# Patient Record
Sex: Female | Born: 1975 | Race: Black or African American | Hispanic: No | Marital: Single | Smoking: Never smoker
Health system: Southern US, Community
[De-identification: ages and names within clinical notes are randomized; demographics above are authoritative.]

## PROBLEM LIST (undated history)

## (undated) DIAGNOSIS — N926 Irregular menstruation, unspecified: Secondary | ICD-10-CM

## (undated) DIAGNOSIS — F419 Anxiety disorder, unspecified: Secondary | ICD-10-CM

## (undated) DIAGNOSIS — Z8742 Personal history of other diseases of the female genital tract: Secondary | ICD-10-CM

## (undated) DIAGNOSIS — B379 Candidiasis, unspecified: Secondary | ICD-10-CM

## (undated) DIAGNOSIS — IMO0002 Reserved for concepts with insufficient information to code with codable children: Secondary | ICD-10-CM

## (undated) DIAGNOSIS — R519 Headache, unspecified: Secondary | ICD-10-CM

## (undated) DIAGNOSIS — D649 Anemia, unspecified: Secondary | ICD-10-CM

## (undated) DIAGNOSIS — M549 Dorsalgia, unspecified: Secondary | ICD-10-CM

## (undated) DIAGNOSIS — E559 Vitamin D deficiency, unspecified: Secondary | ICD-10-CM

## (undated) DIAGNOSIS — R7303 Prediabetes: Secondary | ICD-10-CM

## (undated) DIAGNOSIS — Z8619 Personal history of other infectious and parasitic diseases: Secondary | ICD-10-CM

## (undated) DIAGNOSIS — E669 Obesity, unspecified: Secondary | ICD-10-CM

## (undated) DIAGNOSIS — Z973 Presence of spectacles and contact lenses: Secondary | ICD-10-CM

## (undated) DIAGNOSIS — N870 Mild cervical dysplasia: Secondary | ICD-10-CM

## (undated) DIAGNOSIS — R51 Headache: Secondary | ICD-10-CM

## (undated) HISTORY — DX: Irregular menstruation, unspecified: N92.6

## (undated) HISTORY — DX: Anxiety disorder, unspecified: F41.9

## (undated) HISTORY — DX: Anemia, unspecified: D64.9

## (undated) HISTORY — DX: Personal history of other infectious and parasitic diseases: Z86.19

## (undated) HISTORY — DX: Reserved for concepts with insufficient information to code with codable children: IMO0002

## (undated) HISTORY — DX: Dorsalgia, unspecified: M54.9

## (undated) HISTORY — DX: Mild cervical dysplasia: N87.0

## (undated) HISTORY — DX: Obesity, unspecified: E66.9

## (undated) HISTORY — DX: Presence of spectacles and contact lenses: Z97.3

## (undated) HISTORY — DX: Vitamin D deficiency, unspecified: E55.9

## (undated) HISTORY — DX: Candidiasis, unspecified: B37.9

## (undated) HISTORY — DX: Personal history of other diseases of the female genital tract: Z87.42

---

## 1997-11-20 ENCOUNTER — Encounter: Admission: RE | Admit: 1997-11-20 | Discharge: 1997-11-20 | Payer: Self-pay | Admitting: Sports Medicine

## 1998-01-31 ENCOUNTER — Encounter: Admission: RE | Admit: 1998-01-31 | Discharge: 1998-01-31 | Payer: Self-pay | Admitting: Family Medicine

## 1998-03-15 ENCOUNTER — Encounter: Admission: RE | Admit: 1998-03-15 | Discharge: 1998-03-15 | Payer: Self-pay | Admitting: Family Medicine

## 1998-07-08 ENCOUNTER — Encounter: Admission: RE | Admit: 1998-07-08 | Discharge: 1998-07-08 | Payer: Self-pay | Admitting: Family Medicine

## 1998-09-09 ENCOUNTER — Encounter: Admission: RE | Admit: 1998-09-09 | Discharge: 1998-09-09 | Payer: Self-pay | Admitting: Sports Medicine

## 1998-09-19 ENCOUNTER — Encounter: Admission: RE | Admit: 1998-09-19 | Discharge: 1998-09-19 | Payer: Self-pay | Admitting: Family Medicine

## 1998-10-09 ENCOUNTER — Encounter: Admission: RE | Admit: 1998-10-09 | Discharge: 1998-10-09 | Payer: Self-pay | Admitting: Family Medicine

## 1998-10-22 ENCOUNTER — Encounter: Admission: RE | Admit: 1998-10-22 | Discharge: 1998-10-22 | Payer: Self-pay | Admitting: Sports Medicine

## 1999-01-23 ENCOUNTER — Emergency Department (HOSPITAL_COMMUNITY): Admission: EM | Admit: 1999-01-23 | Discharge: 1999-01-23 | Payer: Self-pay | Admitting: Emergency Medicine

## 1999-01-24 ENCOUNTER — Emergency Department (HOSPITAL_COMMUNITY): Admission: EM | Admit: 1999-01-24 | Discharge: 1999-01-24 | Payer: Self-pay | Admitting: Emergency Medicine

## 1999-04-17 ENCOUNTER — Encounter: Admission: RE | Admit: 1999-04-17 | Discharge: 1999-04-17 | Payer: Self-pay | Admitting: Family Medicine

## 1999-06-10 ENCOUNTER — Encounter: Admission: RE | Admit: 1999-06-10 | Discharge: 1999-06-10 | Payer: Self-pay | Admitting: Sports Medicine

## 2000-02-11 ENCOUNTER — Encounter: Admission: RE | Admit: 2000-02-11 | Discharge: 2000-02-11 | Payer: Self-pay | Admitting: Family Medicine

## 2001-01-04 ENCOUNTER — Encounter: Admission: RE | Admit: 2001-01-04 | Discharge: 2001-01-04 | Payer: Self-pay | Admitting: Sports Medicine

## 2001-01-27 ENCOUNTER — Encounter (INDEPENDENT_AMBULATORY_CARE_PROVIDER_SITE_OTHER): Payer: Self-pay | Admitting: *Deleted

## 2001-01-27 LAB — CONVERTED CEMR LAB

## 2001-02-02 ENCOUNTER — Encounter: Admission: RE | Admit: 2001-02-02 | Discharge: 2001-02-02 | Payer: Self-pay | Admitting: Family Medicine

## 2001-03-04 ENCOUNTER — Encounter: Admission: RE | Admit: 2001-03-04 | Discharge: 2001-03-04 | Payer: Self-pay | Admitting: Family Medicine

## 2001-05-30 ENCOUNTER — Encounter: Admission: RE | Admit: 2001-05-30 | Discharge: 2001-05-30 | Payer: Self-pay | Admitting: Family Medicine

## 2002-05-04 ENCOUNTER — Encounter: Admission: RE | Admit: 2002-05-04 | Discharge: 2002-05-04 | Payer: Self-pay | Admitting: Family Medicine

## 2004-07-17 ENCOUNTER — Other Ambulatory Visit: Admission: RE | Admit: 2004-07-17 | Discharge: 2004-07-17 | Payer: Self-pay | Admitting: Family Medicine

## 2005-07-06 ENCOUNTER — Other Ambulatory Visit: Admission: RE | Admit: 2005-07-06 | Discharge: 2005-07-06 | Payer: Self-pay | Admitting: Obstetrics and Gynecology

## 2005-09-20 ENCOUNTER — Emergency Department (HOSPITAL_COMMUNITY): Admission: EM | Admit: 2005-09-20 | Discharge: 2005-09-21 | Payer: Self-pay | Admitting: Emergency Medicine

## 2006-06-29 DIAGNOSIS — Z8742 Personal history of other diseases of the female genital tract: Secondary | ICD-10-CM

## 2006-06-29 HISTORY — DX: Personal history of other diseases of the female genital tract: Z87.42

## 2006-08-27 ENCOUNTER — Encounter (INDEPENDENT_AMBULATORY_CARE_PROVIDER_SITE_OTHER): Payer: Self-pay | Admitting: *Deleted

## 2006-12-14 DIAGNOSIS — N926 Irregular menstruation, unspecified: Secondary | ICD-10-CM

## 2006-12-14 HISTORY — DX: Irregular menstruation, unspecified: N92.6

## 2008-06-29 HISTORY — PX: COLPOSCOPY: SHX161

## 2008-12-02 ENCOUNTER — Emergency Department (HOSPITAL_COMMUNITY): Admission: EM | Admit: 2008-12-02 | Discharge: 2008-12-03 | Payer: Self-pay | Admitting: Emergency Medicine

## 2009-08-13 DIAGNOSIS — IMO0002 Reserved for concepts with insufficient information to code with codable children: Secondary | ICD-10-CM

## 2009-08-13 DIAGNOSIS — R87612 Low grade squamous intraepithelial lesion on cytologic smear of cervix (LGSIL): Secondary | ICD-10-CM

## 2009-08-13 HISTORY — DX: Reserved for concepts with insufficient information to code with codable children: IMO0002

## 2009-08-13 HISTORY — DX: Low grade squamous intraepithelial lesion on cytologic smear of cervix (LGSIL): R87.612

## 2010-07-30 DIAGNOSIS — N870 Mild cervical dysplasia: Secondary | ICD-10-CM

## 2010-07-30 HISTORY — DX: Mild cervical dysplasia: N87.0

## 2012-01-12 ENCOUNTER — Ambulatory Visit: Payer: Self-pay | Admitting: Obstetrics and Gynecology

## 2012-03-15 ENCOUNTER — Ambulatory Visit: Payer: Self-pay | Admitting: Family Medicine

## 2012-03-16 ENCOUNTER — Ambulatory Visit (INDEPENDENT_AMBULATORY_CARE_PROVIDER_SITE_OTHER): Payer: BC Managed Care – PPO | Admitting: Family Medicine

## 2012-03-16 ENCOUNTER — Encounter: Payer: Self-pay | Admitting: Family Medicine

## 2012-03-16 ENCOUNTER — Other Ambulatory Visit (HOSPITAL_COMMUNITY)
Admission: RE | Admit: 2012-03-16 | Discharge: 2012-03-16 | Disposition: A | Discharge status: Home / Self Care | Payer: BC Managed Care – PPO | Source: Ambulatory Visit | Attending: Family Medicine | Admitting: Family Medicine

## 2012-03-16 VITALS — BP 138/76 | HR 89 | Temp 99.1°F | Ht 63.0 in | Wt 233.3 lb

## 2012-03-16 DIAGNOSIS — Z124 Encounter for screening for malignant neoplasm of cervix: Secondary | ICD-10-CM

## 2012-03-16 DIAGNOSIS — Z309 Encounter for contraceptive management, unspecified: Secondary | ICD-10-CM

## 2012-03-16 DIAGNOSIS — Z01419 Encounter for gynecological examination (general) (routine) without abnormal findings: Secondary | ICD-10-CM | POA: Insufficient documentation

## 2012-03-16 DIAGNOSIS — N870 Mild cervical dysplasia: Secondary | ICD-10-CM | POA: Insufficient documentation

## 2012-03-16 NOTE — Patient Instructions (Addendum)
It was nice to meet you today, Abigail Duran. Continue birth control pills this year.  If you are interested in changing to Nexplanon, please call us. For headaches, try using heating pads and taking Aleve to see if this helps. You may also try monthly massages. We will call or send a letter with pap results. Schedule follow up appointment with me in 3 months.

## 2012-03-16 NOTE — Assessment & Plan Note (Signed)
Repeat pap today

## 2012-03-16 NOTE — Assessment & Plan Note (Addendum)
Gave patient handout on Nexplanon.  Continue OCP - may need to stop this after > 34 years old.

## 2012-03-16 NOTE — Progress Notes (Signed)
  Subjective:     Abigail Duran is a 36 y.o. female and is here for a comprehensive physical exam. The patient reports intermittent headaches.  She has been taking OCP for about one year and is thinking about stopping them due to HA.  She is sexually active and almost 36 yo, but does not smoke.  HA are relieved with NSAIDS and are not associated with nausea/vomiting or aura.   Otherwise, she has no acute complaints.  Patient has been trying to lose weight - she walks about 4 times per week and has been on a diet.  She works at [employer].  Of note, patient is here for a pap.  She has been diagnosed with CIN 1 in 2012.  History   Social History  . Marital Status: Married    Spouse Name: N/A    Number of Children: N/A  . Years of Education: N/A   Occupational History  . Not on file.   Social History Main Topics  . Smoking status: Never Smoker   . Smokeless tobacco: Never Used  . Alcohol Use: No  . Drug Use: No  . Sexually Active: Yes   Other Topics Concern  . Not on file   Social History Narrative  . No narrative on file   No health maintenance topics applied.  The following portions of the patient's history were reviewed and updated as appropriate: allergies, current medications, past family history, past medical history, past social history, past surgical history and problem list.  Review of Systems Pertinent items are noted in HPI.   Objective:    General appearance: alert, cooperative and no distress Head: Normocephalic, without obvious abnormality, atraumatic Neck: no adenopathy, no carotid bruit and no JVD Lungs: clear to auscultation bilaterally Heart: regular rate and rhythm, S1, S2 normal, no murmur, click, rub or gallop Abdomen: soft, non-tender; bowel sounds normal; no masses,  no organomegaly and obese Pelvic: cervix normal in appearance, external genitalia normal, no adnexal masses or tenderness, no cervical motion tenderness and uterus normal size, shape, and  consistency Skin: Skin color, texture, turgor normal. No rashes or lesions Neurologic: Grossly normal    Assessment:    Healthy female exam.   Hx of LGSIL  --> CIN 1.     Plan:   See Problem List.   See After Visit Summary for Counseling Recommendations

## 2012-03-16 NOTE — Addendum Note (Signed)
Addended by: Jimmy Footman K on: 03/16/2012 05:07 PM   Modules accepted: Orders

## 2012-03-21 ENCOUNTER — Encounter: Payer: Self-pay | Admitting: Family Medicine

## 2013-07-04 ENCOUNTER — Encounter: Payer: Self-pay | Admitting: Medical

## 2013-07-19 ENCOUNTER — Encounter: Payer: Self-pay | Admitting: Medical

## 2013-07-19 ENCOUNTER — Other Ambulatory Visit (HOSPITAL_COMMUNITY)
Admission: RE | Admit: 2013-07-19 | Discharge: 2013-07-19 | Disposition: A | Discharge status: Home / Self Care | Payer: Commercial Indemnity | Source: Ambulatory Visit | Attending: Medical | Admitting: Medical

## 2013-07-19 ENCOUNTER — Ambulatory Visit (INDEPENDENT_AMBULATORY_CARE_PROVIDER_SITE_OTHER): Payer: Managed Care, Other (non HMO) | Admitting: Medical

## 2013-07-19 VITALS — BP 124/82 | HR 78 | Temp 98.1°F | Resp 16 | Ht 64.0 in | Wt 238.0 lb

## 2013-07-19 DIAGNOSIS — Z124 Encounter for screening for malignant neoplasm of cervix: Secondary | ICD-10-CM

## 2013-07-19 DIAGNOSIS — Z113 Encounter for screening for infections with a predominantly sexual mode of transmission: Secondary | ICD-10-CM

## 2013-07-19 DIAGNOSIS — Z0000 Encounter for general adult medical examination without abnormal findings: Secondary | ICD-10-CM

## 2013-07-19 DIAGNOSIS — E669 Obesity, unspecified: Secondary | ICD-10-CM

## 2013-07-19 DIAGNOSIS — R87810 Cervical high risk human papillomavirus (HPV) DNA test positive: Secondary | ICD-10-CM | POA: Insufficient documentation

## 2013-07-19 DIAGNOSIS — Z23 Encounter for immunization: Secondary | ICD-10-CM

## 2013-07-19 DIAGNOSIS — L709 Acne, unspecified: Secondary | ICD-10-CM

## 2013-07-19 DIAGNOSIS — R51 Headache: Secondary | ICD-10-CM

## 2013-07-19 DIAGNOSIS — R519 Headache, unspecified: Secondary | ICD-10-CM

## 2013-07-19 DIAGNOSIS — Z1151 Encounter for screening for human papillomavirus (HPV): Secondary | ICD-10-CM | POA: Insufficient documentation

## 2013-07-19 DIAGNOSIS — L708 Other acne: Secondary | ICD-10-CM

## 2013-07-19 LAB — POCT URINALYSIS DIPSTICK
Bilirubin, UA: NEGATIVE
Blood, UA: NEGATIVE
Glucose, UA: NEGATIVE
KETONES UA: NEGATIVE
Leukocytes, UA: NEGATIVE
NITRITE UA: NEGATIVE
Protein, UA: NEGATIVE
Spec Grav, UA: 1.015
UROBILINOGEN UA: NEGATIVE
pH, UA: 6

## 2013-07-19 LAB — HEMOGLOBIN A1C
HEMOGLOBIN A1C: 5.9 % — AB (ref <5.7)
MEAN PLASMA GLUCOSE: 123 mg/dL — AB (ref <117)

## 2013-07-19 MED ORDER — CLINDAMYCIN PHOS-BENZOYL PEROX 1.2-5 % EX GEL: 1.0000 "application " | Freq: Two times a day (BID) | CUTANEOUS | Status: DC

## 2013-07-19 NOTE — Progress Notes (Signed)
Subjective:   HPI  Abigail Duran is a 38 y.o. female who presents for a complete physical.  New patient today.  Was [facility].   Wanted to change providers.   Preventative care: Last ophthalmology visit: [facility], sees next week Last dental visit: Dr. Ned CardStacey Greene, up to date, sees them soon Last colonoscopy: never Last mammogram: never Last gynecological exam: Last EKG: never Last labs: ?  Prior vaccinations: TD or Tdap: ?  Concerns: Obesity - was up to 290lb at one point.  still needs to lose more, but has made significant improvements.  Tries to eat healthy.  Was going to bariatric clinic prior.  Was on phentermine prior.   Had no problems prior.    Gyn history - no prior pregnancy.  Hx/o abnormal pap, 2010.  colonoscopy 2010?  Normal paps since.   No hx/o HPV vaccine.  No hx/o STD.  Sexually active with 1 partner x 2 years.   Last STD screen 2013.  Periods are regular, not too heavy, but sometimes bad cramping.  Been off birth control since 2013.  OCPs helped regulate her periods, Lo-orvrelle.    Reviewed their medical, surgical, family, social, medication, and allergy history and updated chart as appropriate.  Past Medical History  Diagnosis Date  . History of varicella   . Yeast infection   . Irregular periods/menstrual cycles 12/14/06  . H/O: obesity   . History of PCOS 2008  . LGSIL (low grade squamous intraepithelial lesion) on Pap smear 08/13/09    colpo  . CIN I (cervical intraepithelial neoplasia I) 07/30/2010  . Obesity   . Wears glasses     History reviewed. No pertinent past surgical history.  History   Social History  . Marital Status: Married    Spouse Name: N/A    Number of Children: N/A  . Years of Education: N/A   Occupational History  . Not on file.   Social History Main Topics  . Smoking status: Never Smoker   . Smokeless tobacco: Never Used  . Alcohol Use: No  . Drug Use: No  . Sexual Activity:  Yes   Other Topics Concern  . Not on file   Social History Narrative   Lives alone, works in medical records, Exercise - none since cold weather.  [place]      Family History  Problem Relation Age of Onset  . Asthma Mother   . Depression Mother   . Hypertension Mother   . Other Mother     back surgery  . COPD Mother   . Alcohol abuse Father   . Early death Father   . Hypertension Father   . Cirrhosis Father     died of cirrhosis  . Sickle cell trait Brother   . Heart disease Maternal Grandmother   . Cancer Neg Hx   . Diabetes Neg Hx   . Stroke Neg Hx     Current outpatient prescriptions:docusate sodium (COLACE) 100 MG capsule, Take 100 mg by mouth 2 (two) times daily., Disp: , Rfl: ;  Prenatal Multivit-Min-Fe-FA (PRENATAL VITAMINS PO), Take by mouth., Disp: , Rfl:   Allergies  Allergen Reactions  . Codeine Nausea And Vomiting     Review of Systems Constitutional: -fever, -chills, -sweats, -unexpected weight change, -decreased appetite, -fatigue Allergy: -sneezing, -itching, -congestion Dermatology: -changing moles, --rash, -lumps ENT: -runny nose, -ear pain, -sore throat, -hoarseness, -sinus pain, -teeth pain, - ringing in ears, -  hearing loss, -nosebleeds Cardiology: -chest pain, -palpitations, -swelling, -difficulty breathing when lying flat, -waking up short of breath Respiratory: -cough, -shortness of breath, -difficulty breathing with exercise or exertion, -wheezing, -coughing up blood Gastroenterology: -abdominal pain, -nausea, -vomiting, -diarrhea, -constipation, -blood in stool, -changes in bowel movement, -difficulty swallowing or eating Hematology: -bleeding, -bruising  Musculoskeletal: -joint aches, -muscle aches, -joint swelling, -back pain, -neck pain, -cramping, -changes in gait Ophthalmology: denies vision changes, eye redness, itching, discharge Urology: -burning with urination, -difficulty urinating, -blood in urine, -urinary  frequency, -urgency, -incontinence Neurology: + occasional headache, -weakness, -tingling, -numbness, -memory loss, -falls, -dizziness Psychology: -depressed mood, -agitation, +sleep problems     Objective:   Physical Exam  BP 124/82  Pulse 78  Temp(Src) 98.1 F (36.7 C)  Resp 16  Ht 5\' 4"  (1.626 m)  Wt 238 lb (107.956 kg)  BMI 40.83 kg/m2  SpO2 98%  General appearance: alert, no distress, WD/WN, obese AA female Skin: mild to moderate facial acne, dry rough skin around neck/eczema, tattoo low back, no worrisome lesions HEENT: normocephalic, conjunctiva/corneas normal, sclerae anicteric, PERRLA, EOMi, nares patent, no discharge or erythema, pharynx normal Oral cavity: MMM, tongue normal, teeth in good repair Neck: supple, no lymphadenopathy, no thyromegaly, no masses, normal ROM, no bruits Chest: non tender, normal shape and expansion Heart: RRR, normal S1, S2, no murmurs Lungs: CTA bilaterally, no wheezes, rhonchi, or rales Abdomen: +bs, soft, non tender, non distended, no masses, no hepatomegaly, no splenomegaly, no bruits Back: non tender, normal ROM, no scoliosis Musculoskeletal: upper extremities non tender, no obvious deformity, normal ROM throughout, lower extremities non tender, no obvious deformity, normal ROM throughout Extremities: no edema, no cyanosis, no clubbing Pulses: 2+ symmetric, upper and lower extremities, normal cap refill Neurological: alert, oriented x 3, CN2-12 intact, strength normal upper extremities and lower extremities, sensation normal throughout, DTRs 2+ throughout, no cerebellar signs, gait normal Psychiatric: normal affect, behavior normal, pleasant  Breast: nontender, no masses or lumps, no skin changes, no nipple discharge or inversion, no axillary lymphadenopathy Gyn: Normal external genitalia without lesions, vagina with normal mucosa, cervix with colposcopy scar, no worrisome lesions, no cervical motion tenderness, no abnormal vaginal discharge.   Uterus and adnexa not enlarged, nontender, no masses.  Pap performed.  Exam chaperoned by nurse. Rectal: deferred    Assessment and Plan :    Encounter Diagnoses  Name Primary?  . Routine general medical examination at a health care facility Yes  . Headache   . Obesity, unspecified   . Screening for cervical cancer   . Screen for STD (sexually transmitted disease)   . Need for Tdap vaccination   . Acne     Physical exam - discussed healthy lifestyle, diet, exercise, preventative care, vaccinations, and addressed their concerns.  Handout given.  Headache - abnormal vision exam today.  He has followup with her eye doctor next week. Her headaches could be coming from her vision. Discussed stress reduction.  Obesity-discussed need for continued weight loss, routine exercise and healthy diet. Pending labs she can followup to discuss weight loss medications to help in her efforts  Pap sent today, STD screening today, discussed risk and benefits of contraception, both hormonal and other methods.  She will consider options.  She is contemplating getting pregnant though.  C/t prenatal vitamin.  Counseled on the Tdap (tetanus, diptheria, and acellular pertussis) vaccine.  Vaccine information sheet given. Tdap vaccine given after consent obtained.  Acne - begin Duac gel, c/t BID facial wash, recheck 1-19mo.  Follow-up pending labs

## 2013-07-19 NOTE — Patient Instructions (Signed)

## 2013-07-19 NOTE — Addendum Note (Signed)
Addended by: Jeri CosELLEBY, Fred Franzen S on: 07/19/2013 10:26 AM   Modules accepted: Orders

## 2013-07-20 LAB — COMPREHENSIVE METABOLIC PANEL
ALBUMIN: 4 g/dL (ref 3.5–5.2)
ALK PHOS: 62 U/L (ref 39–117)
ALT: 16 U/L (ref 0–35)
AST: 19 U/L (ref 0–37)
BUN: 15 mg/dL (ref 6–23)
CO2: 31 mEq/L (ref 19–32)
Calcium: 9.4 mg/dL (ref 8.4–10.5)
Chloride: 101 mEq/L (ref 96–112)
Creat: 0.72 mg/dL (ref 0.50–1.10)
Glucose, Bld: 95 mg/dL (ref 70–99)
POTASSIUM: 4.2 meq/L (ref 3.5–5.3)
SODIUM: 136 meq/L (ref 135–145)
TOTAL PROTEIN: 7.1 g/dL (ref 6.0–8.3)
Total Bilirubin: 0.3 mg/dL (ref 0.3–1.2)

## 2013-07-20 LAB — CBC
HCT: 39 % (ref 36.0–46.0)
Hemoglobin: 12.9 g/dL (ref 12.0–15.0)
MCH: 28.3 pg (ref 26.0–34.0)
MCHC: 33.1 g/dL (ref 30.0–36.0)
MCV: 85.5 fL (ref 78.0–100.0)
PLATELETS: 269 10*3/uL (ref 150–400)
RBC: 4.56 MIL/uL (ref 3.87–5.11)
RDW: 15.2 % (ref 11.5–15.5)
WBC: 4.1 10*3/uL (ref 4.0–10.5)

## 2013-07-20 LAB — LIPID PANEL
Cholesterol: 198 mg/dL (ref 0–200)
HDL: 58 mg/dL (ref >39)
LDL Cholesterol: 119 mg/dL — ABNORMAL HIGH (ref 0–99)
Total CHOL/HDL Ratio: 3.4 Ratio
Triglycerides: 106 mg/dL (ref <150)
VLDL: 21 mg/dL (ref 0–40)

## 2013-07-20 LAB — RPR

## 2013-07-20 LAB — HIV ANTIBODY (ROUTINE TESTING W REFLEX): HIV: NONREACTIVE

## 2013-07-20 LAB — TSH: TSH: 2.011 u[IU]/mL (ref 0.350–4.500)

## 2013-07-21 ENCOUNTER — Other Ambulatory Visit: Payer: Self-pay | Admitting: Medical

## 2013-07-21 MED ORDER — AZITHROMYCIN 1 G PO PACK: 1.0000 g | PACK | Freq: Once | ORAL | Status: DC

## 2013-07-24 ENCOUNTER — Ambulatory Visit (INDEPENDENT_AMBULATORY_CARE_PROVIDER_SITE_OTHER): Payer: Managed Care, Other (non HMO) | Admitting: Medical

## 2013-07-24 ENCOUNTER — Encounter: Payer: Self-pay | Admitting: Medical

## 2013-07-24 VITALS — BP 120/80 | HR 60 | Temp 98.3°F | Resp 16 | Wt 230.0 lb

## 2013-07-24 DIAGNOSIS — A749 Chlamydial infection, unspecified: Secondary | ICD-10-CM

## 2013-07-24 DIAGNOSIS — R7301 Impaired fasting glucose: Secondary | ICD-10-CM

## 2013-07-24 DIAGNOSIS — E669 Obesity, unspecified: Secondary | ICD-10-CM

## 2013-07-24 MED ORDER — PHENTERMINE HCL 37.5 MG PO CAPS: 37.5000 mg | ORAL_CAPSULE | ORAL | Status: DC

## 2013-07-24 NOTE — Progress Notes (Signed)
Subjective:     Abigail EdmanSonya S Duran is a 38 y.o. female here for discussion of recent abnormal labs, obesity, efforts for weight loss.  She did see bariatric clinic prior, was on HCG shots and phentermine but this was costing over $200/mo.  She is exercising, trying to eat healthy.  Wants help with weight loss.   She notes history is a Pap smears for the last several years, had LSIL a couple years ago with subsequent colposcopy.  Last STD screening in 2012 is negative for Chlamydia and gonorrhea.  Her current partner of 2 years and her have been serious with their relationship.  She is concerned as neither of them admitted to having any other sexual partners although she turned up positive for Chlamydia this time.  She has questions about the diagnosis.  The following portions of the patient's history were reviewed and updated as appropriate: allergies, current medications, past family history, past medical history, past social history, past surgical history and problem list.  Review of Systems As in subjective  Past Medical History  Diagnosis Date  . History of varicella   . Yeast infection   . Irregular periods/menstrual cycles 12/14/06  . H/O: obesity   . History of PCOS 2008  . LGSIL (low grade squamous intraepithelial lesion) on Pap smear 08/13/09    colpo  . CIN I (cervical intraepithelial neoplasia I) 07/30/2010  . Obesity   . Wears glasses       Objective:   Filed Vitals:   07/24/13 1030  BP: 120/80  Pulse: 60  Temp: 98.3 F (36.8 C)  Resp: 16    General appearance: alert, no distress, WD/WN, obese  Psych: Pleasant, answers questions appropriate, good eye contact   Assessment:   Encounter Diagnoses  Name Primary?  . Obesity, unspecified Yes  . Impaired fasting blood sugar   . Chlamydia     Plan:   We discussed her recent lab results including impaired fasting glucose and positive Chlamydia.  Obesity - General weight loss/lifestyle modification strategies discussed  including 1800-calorie per day diet, exercise most days of the week, spent most of the time discussing diet changes, identify weaknesses, use non-food rewards, set short term goals. Discussed medication options.  Begin Phentermine daily.  She has taken this in the past with good benefit without side effects. Discussed risks/benefits of medications.  Recommend she use my fitness PAL at the Smart phone to record her progress. Recheck in 2-3 months, sooner when necessary.  Impaired fasting glucose-lifestyle changes advised  Chlamydia-we discussed her recent labs, she has finished the azithromycin.  Of note she was negative for Chlamydia in 2012.  Her partner has also went to get treated. We discussed STDs, means of transmission, and she will followup in 2 weeks for repeat urine.

## 2013-08-07 ENCOUNTER — Other Ambulatory Visit: Payer: Managed Care, Other (non HMO)

## 2013-08-07 DIAGNOSIS — A749 Chlamydial infection, unspecified: Secondary | ICD-10-CM

## 2013-08-08 LAB — GC/CHLAMYDIA PROBE AMP
CT PROBE, AMP APTIMA: NEGATIVE
GC PROBE AMP APTIMA: NEGATIVE

## 2013-10-23 ENCOUNTER — Ambulatory Visit: Payer: Managed Care, Other (non HMO) | Admitting: Medical

## 2013-11-22 ENCOUNTER — Ambulatory Visit: Payer: Managed Care, Other (non HMO) | Admitting: Medical

## 2013-11-29 ENCOUNTER — Encounter: Payer: Self-pay | Admitting: Family Medicine

## 2013-12-26 ENCOUNTER — Other Ambulatory Visit: Payer: Self-pay | Admitting: Medical

## 2014-03-28 ENCOUNTER — Ambulatory Visit: Payer: Managed Care, Other (non HMO) | Admitting: Medical

## 2014-08-09 ENCOUNTER — Encounter: Payer: Self-pay | Admitting: Medical

## 2014-08-09 ENCOUNTER — Ambulatory Visit (INDEPENDENT_AMBULATORY_CARE_PROVIDER_SITE_OTHER): Payer: Managed Care, Other (non HMO) | Admitting: Medical

## 2014-08-09 VITALS — BP 128/80 | HR 76 | Temp 98.6°F | Resp 16 | Wt 236.0 lb

## 2014-08-09 DIAGNOSIS — R5383 Other fatigue: Secondary | ICD-10-CM

## 2014-08-09 DIAGNOSIS — N926 Irregular menstruation, unspecified: Secondary | ICD-10-CM

## 2014-08-09 DIAGNOSIS — L709 Acne, unspecified: Secondary | ICD-10-CM

## 2014-08-09 DIAGNOSIS — E669 Obesity, unspecified: Secondary | ICD-10-CM

## 2014-08-09 LAB — POCT URINE PREGNANCY: Preg Test, Ur: NEGATIVE

## 2014-08-09 MED ORDER — PHENTERMINE HCL 37.5 MG PO TABS: 37.5000 mg | ORAL_TABLET | Freq: Every day | ORAL | Status: DC

## 2014-08-09 MED ORDER — BENZOYL PEROXIDE 5 % EX LIQD: Freq: Two times a day (BID) | CUTANEOUS | Status: DC

## 2014-08-09 NOTE — Progress Notes (Signed)
Subjective: Here for multiple current concerns, last visit January 2015.  Obesity-she would like to restart phentermine.  Exercise - walking, 2 day per week.   Diet not good of late.  But is making changes, entered weight loss challenge at work.  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   Wants her B-12 checked, feels fatigue, low energy.  Periods are usually regular 21st of the month.   Last month missed a period, would like a pregnancy test.  Sexually active without birth control.  Acne - the last time she was here we prescribed a cream that didn't seem to help.  The cream Duac was too strong, made her itch.   Acne mostly on cheeks and forehead.    No other c/o.  Past Medical History  Diagnosis Date  . History of varicella   . Yeast infection   . Irregular periods/menstrual cycles 12/14/06  . H/O: obesity   . History of PCOS 2008  . LGSIL (low grade squamous intraepithelial lesion) on Pap smear 08/13/09    colpo  . CIN I (cervical intraepithelial neoplasia I) 07/30/2010  . Obesity   . Wears glasses      Objective: BP 128/80 mmHg  Pulse 76  Temp(Src) 98.6 F (37 C) (Oral)  Resp 16  Wt 236 lb (107.049 kg)  LMP 08/08/2014 (Exact Date)  Wt Readings from Last 3 Encounters:  08/09/14 236 lb (107.049 kg)  07/24/13 230 lb (104.327 kg)  07/19/13 238 lb (107.956 kg)    General appearance: alert, no distress, WD/WN, obese AA female Skin: mild to moderate facial acne, non vulgaris Neck: supple, no lymphadenopathy, no thyromegaly, no masses Heart: RRR, normal S1, S2, no murmurs Lungs: CTA bilaterally, no wheezes, rhonchi, or rales Abdomen: +bs, soft, non tender, non distended, no masses, no hepatomegaly, no splenomegaly Pulses: 2+ symmetric, upper and lower extremities, normal cap refill Ext: no edema   Assessment: Encounter Diagnoses  Name Primary?  . Obesity Yes  . Irregular menses   . Missed period   . Acne, unspecified acne type   . Other fatigue     Obesity -  restart phentermine, discussed risks/benefits of medications, need for much improved diet and exercise measures, can use B12 OTC Acne - BID facial washes.   Begin benzoyl peroxide topical, f/u 51mo fatigue - same plan as obesity Missed period - urine pregnancy negative.  Discussed condom use.  She declines OCPs.   Recheck in 1-2 mo for physical and fasting labs.

## 2014-08-09 NOTE — Patient Instructions (Signed)
  Thank you for giving me the opportunity to serve you today.    Your diagnosis today includes: Encounter Diagnoses  Name Primary?  . Obesity Yes  . Irregular menses   . Missed period   . Acne, unspecified acne type   . Other fatigue      Specific recommendations today include: Diet  Increase your water intake, get at least 64 ounces of water daily  Eat 3-4 fruits daily  Eat plenty of vegetables throughout the day, preferably each meal  Eat good sources of grains such as oatmeal, barley, whole grain pasta, whole grain bread, but limit the serving size to 1 cup of oatmeal or pasta per meal or 2 slices of bread per meal  We don't need to meat at each meal, however if you do eat meat, limit serving size to the size of your palm, and eat chicken fish or [place]key, lean cuts of meat  Eat beans every day as this is a good nutrient source and helps to curb appetite  Consider using a program such as Weight Watchers  Consider using a Smart phone app such as My Fitness PAL or [employer] to track your calories and progress   Things to limit or avoid:  Avoid fast food, fried foods, fatty foods  Limit sweets, ice cream, cake and other baked goods  Avoid soda, beer, alcohol, sweet tea  Exercise  You need to be exercising most days of the week for 30-45 minutes or more  Good forms of exercise include walking, hiking, stationary bike or bicycling outside, lap swimming, aerobics class, dance, Zumba  Consider getting a trainer at a gym to help with exercise  Medication Restart Phentermine once daily   Consider weighing yourself daily to keep track of your weight  Return within 1-2 months for fasting labs and physical

## 2014-08-28 ENCOUNTER — Other Ambulatory Visit (HOSPITAL_COMMUNITY)
Admission: RE | Admit: 2014-08-28 | Discharge: 2014-08-28 | Disposition: A | Discharge status: Home / Self Care | Payer: Commercial Indemnity | Source: Ambulatory Visit | Attending: Medical | Admitting: Medical

## 2014-08-28 ENCOUNTER — Ambulatory Visit (INDEPENDENT_AMBULATORY_CARE_PROVIDER_SITE_OTHER): Payer: Managed Care, Other (non HMO) | Admitting: Medical

## 2014-08-28 ENCOUNTER — Encounter: Payer: Self-pay | Admitting: Medical

## 2014-08-28 VITALS — BP 140/90 | HR 90 | Temp 98.5°F | Resp 16 | Ht 64.2 in | Wt 239.0 lb

## 2014-08-28 DIAGNOSIS — Z124 Encounter for screening for malignant neoplasm of cervix: Secondary | ICD-10-CM | POA: Diagnosis not present

## 2014-08-28 DIAGNOSIS — E669 Obesity, unspecified: Secondary | ICD-10-CM | POA: Diagnosis not present

## 2014-08-28 DIAGNOSIS — Z79899 Other long term (current) drug therapy: Secondary | ICD-10-CM | POA: Diagnosis not present

## 2014-08-28 DIAGNOSIS — Z113 Encounter for screening for infections with a predominantly sexual mode of transmission: Secondary | ICD-10-CM

## 2014-08-28 DIAGNOSIS — Z0000 Encounter for general adult medical examination without abnormal findings: Secondary | ICD-10-CM

## 2014-08-28 DIAGNOSIS — Z01411 Encounter for gynecological examination (general) (routine) with abnormal findings: Secondary | ICD-10-CM | POA: Diagnosis not present

## 2014-08-28 DIAGNOSIS — L83 Acanthosis nigricans: Secondary | ICD-10-CM | POA: Diagnosis not present

## 2014-08-28 DIAGNOSIS — N760 Acute vaginitis: Secondary | ICD-10-CM | POA: Diagnosis present

## 2014-08-28 DIAGNOSIS — Z8742 Personal history of other diseases of the female genital tract: Secondary | ICD-10-CM | POA: Insufficient documentation

## 2014-08-28 DIAGNOSIS — R030 Elevated blood-pressure reading, without diagnosis of hypertension: Secondary | ICD-10-CM | POA: Diagnosis not present

## 2014-08-28 LAB — LIPID PANEL
CHOLESTEROL: 189 mg/dL (ref 0–200)
HDL: 54 mg/dL (ref >=46)
LDL CALC: 116 mg/dL — AB (ref 0–99)
TRIGLYCERIDES: 96 mg/dL (ref <150)
Total CHOL/HDL Ratio: 3.5 Ratio
VLDL: 19 mg/dL (ref 0–40)

## 2014-08-28 LAB — COMPREHENSIVE METABOLIC PANEL
ALBUMIN: 3.9 g/dL (ref 3.5–5.2)
ALT: 14 U/L (ref 0–35)
AST: 17 U/L (ref 0–37)
Alkaline Phosphatase: 64 U/L (ref 39–117)
BUN: 11 mg/dL (ref 6–23)
CALCIUM: 8.9 mg/dL (ref 8.4–10.5)
CHLORIDE: 101 meq/L (ref 96–112)
CO2: 30 meq/L (ref 19–32)
CREATININE: 0.76 mg/dL (ref 0.50–1.10)
Glucose, Bld: 102 mg/dL — ABNORMAL HIGH (ref 70–99)
POTASSIUM: 4 meq/L (ref 3.5–5.3)
Sodium: 137 mEq/L (ref 135–145)
TOTAL PROTEIN: 6.8 g/dL (ref 6.0–8.3)
Total Bilirubin: 0.2 mg/dL (ref 0.2–1.2)

## 2014-08-28 LAB — CBC
HEMATOCRIT: 38.9 % (ref 36.0–46.0)
HEMOGLOBIN: 12.6 g/dL (ref 12.0–15.0)
MCH: 27.7 pg (ref 26.0–34.0)
MCHC: 32.4 g/dL (ref 30.0–36.0)
MCV: 85.5 fL (ref 78.0–100.0)
MPV: 10 fL (ref 8.6–12.4)
Platelets: 247 10*3/uL (ref 150–400)
RBC: 4.55 MIL/uL (ref 3.87–5.11)
RDW: 14.9 % (ref 11.5–15.5)
WBC: 4.4 10*3/uL (ref 4.0–10.5)

## 2014-08-28 LAB — POCT URINALYSIS DIPSTICK
BILIRUBIN UA: NEGATIVE
Glucose, UA: NEGATIVE
Ketones, UA: NEGATIVE
Leukocytes, UA: NEGATIVE
NITRITE UA: NEGATIVE
PH UA: 6
Protein, UA: NEGATIVE
RBC UA: NEGATIVE
Spec Grav, UA: >=1.03
Urobilinogen, UA: NEGATIVE

## 2014-08-28 NOTE — Progress Notes (Signed)
Subjective:   HPI  Abigail Duran is a 39 y.o. female who presents for a complete physical.  Preventative care: Last ophthalmology visit: yes Dr. Lorin PicketScott seen on 07/18/2014 Last dental visit:yes Dr. Chilton SiGreen Last colonoscopy:n/a Last mammogram:n/a Last gynecological exam:08/28/2014 today Last EKG:n/a Last labs:06/2013  Prior vaccinations: TD or Tdap:2015 Influenza:03/2014 Pneumococcal:n/a Shingles/Zostavax:n/a Advanced directive:n.a Health care power of attorney:n/a Living will:n/a  Concerns: Stressed recently.  Cousin's father found dead in his truck for 3 days.  Recent broke up with boyfriend.    Obesity - was up to 290lb at one point. still needs to lose more, but has made significant improvements. Tries to eat healthy. Was going to bariatric clinic prior. recently started back on phentermine, starting a weight loss challenge at work with nutrition counseling.   Gyn history - no prior pregnancy. No hx/o HPV vaccine. Been off birth control since 2013. OCPs helped regulate her periods, Lo-orvrelle in the past.  She notes history of abnormal Pap , had LSIL a couple years ago with subsequent colposcopy. she had STD+ on last year's pap.   Just recently broke up with her boyfriend.   Reviewed their medical, surgical, family, social, medication, and allergy history and updated chart as appropriate.  Past Medical History  Diagnosis Date  . History of varicella   . Yeast infection     hx/o  . Irregular periods/menstrual cycles 12/14/06  . History of PCOS 2008  . LGSIL (low grade squamous intraepithelial lesion) on Pap smear 08/13/09    colpo  . CIN I (cervical intraepithelial neoplasia I) 07/30/2010  . Obesity   . Wears glasses     Past Surgical History  Procedure Laterality Date  . Colposcopy  2010    History   Social History  . Marital Status: Married    Spouse Name: N/A  . Number of Children: N/A  . Years of Education: N/A   Occupational History  . Not on file.    Social History Main Topics  . Smoking status: Never Smoker   . Smokeless tobacco: Never Used  . Alcohol Use: No  . Drug Use: No  . Sexual Activity: Yes   Other Topics Concern  . 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  Exercise - walking and weight loss challenge at work.  Hip hop dance.  [place] [facility] [place]  No children.  No current significant other.  As of 08/2014.    Family History  Problem Relation Age of Onset  . Asthma Mother   . Depression Mother   . Hypertension Mother   . Other Mother     back surgery  . COPD Mother   . Alcohol abuse Father   . Early death Father   . Hypertension Father   . Cirrhosis Father     died of cirrhosis  . Sickle cell trait Brother   . Heart disease Maternal Grandmother   . Cancer Neg Hx   . Diabetes Neg Hx   . Stroke Neg Hx      Current outpatient prescriptions:  .  benzoyl peroxide (BENZOYL PEROXIDE) 5 % external liquid, Apply topically 2 (two) times daily., Disp: 142 g, Rfl: 2 .  Clindamycin-Benzoyl Per, Refr, (DUAC) gel, Apply 1 application topically 2 (two) times daily., Disp: 45 g, Rfl: 1 .  phentermine (ADIPEX-P) 37.5 MG tablet, Take 1 tablet (37.5 mg total) by mouth daily before breakfast., Disp: 30 tablet, Rfl: 1  Allergies  Allergen Reactions  . Codeine Nausea And Vomiting       Review of Systems Constitutional: -fever, -chills, -sweats, -unexpected weight change, -decreased appetite, -fatigue Allergy: -sneezing, -itching, -congestion Dermatology: -changing moles, --rash, -lumps ENT: -runny nose, -ear pain, -sore throat, -hoarseness, -sinus pain, -teeth pain, - ringing in ears, -hearing loss, -nosebleeds Cardiology: -chest pain, -palpitations, -swelling, -difficulty breathing when lying flat, -waking up short of breath Respiratory: -cough, -shortness of breath, -difficulty breathing with exercise or exertion, -wheezing, -coughing up  blood Gastroenterology: -abdominal pain, -nausea, -vomiting, -diarrhea, -constipation, -blood in stool, -changes in bowel movement, -difficulty swallowing or eating Hematology: -bleeding, -bruising  Musculoskeletal: -joint aches, -muscle aches, -joint swelling, -back pain, -neck pain, -cramping, -changes in gait Ophthalmology: denies vision changes, eye redness, itching, discharge Urology: -burning with urination, -difficulty urinating, -blood in urine, -urinary frequency, -urgency, -incontinence Neurology: -headache, -weakness, -tingling, -numbness, -memory loss, -falls, -dizziness Psychology: -depressed mood, -agitation, -sleep problems     Objective:   Physical Exam  BP 140/90 mmHg  Pulse 90  Temp(Src) 98.5 F (36.9 C) (Oral)  Resp 16  Ht 5' 4.2" (1.631 m)  Wt 239 lb (108.41 kg)  BMI 40.75 kg/m2  LMP 08/08/2014 (Exact Date)  BP Readings from Last 3 Encounters:  08/28/14 140/90  08/09/14 128/80  07/24/13 120/80   Wt Readings from Last 3 Encounters:  08/28/14 239 lb (108.41 kg)  08/09/14 236 lb (107.049 kg)  07/24/13 230 lb (104.327 kg)   General appearance: alert, no distress, WD/WN, obese AA female Skin: mild to moderate facial acne, darkened skin around neck and axilla and beneath both breast suggestive of a cantholysis nigra cans, tattoo low back, no worrisome lesions HEENT: normocephalic, conjunctiva/corneas normal, sclerae anicteric, PERRLA, EOMi, nares patent, no discharge or erythema, pharynx normal Oral cavity: MMM, tongue normal, teeth in good repair Neck: supple, no lymphadenopathy, no thyromegaly, no masses, normal ROM, no bruits Chest: non tender, normal shape and expansion Heart: RRR, normal S1, S2, no murmurs Lungs: CTA bilaterally, no wheezes, rhonchi, or rales Abdomen: +bs, soft, non tender, non distended, no masses, no hepatomegaly, no splenomegaly, no bruits Back: non tender, normal ROM, no scoliosis Musculoskeletal: upper extremities non tender, no  obvious deformity, normal ROM throughout, lower extremities non tender, no obvious deformity, normal ROM throughout Extremities: no edema, no cyanosis, no clubbing Pulses: 2+ symmetric, upper and lower extremities, normal cap refill Neurological: alert, oriented x 3, CN2-12 intact, strength normal upper extremities and lower extremities, sensation normal throughout, DTRs 2+ throughout, no cerebellar signs, gait normal Psychiatric: normal affect, behavior normal, pleasant  Breast: nontender, no masses or lumps, no skin changes, no nipple discharge or inversion, no axillary lymphadenopathy Gyn: Normal external genitalia without lesions, vagina with normal mucosa, cervix with colposcopy scar, slight erythema, no worrisome lesions, no cervical motion tenderness, no abnormal vaginal discharge. Uterus and adnexa not enlarged, nontender, no masses. Pap performed. Exam chaperoned by nurse. Rectal: deferred  Assessment and Plan :    Encounter Diagnoses  Name Primary?  . Encounter for health maintenance examination in adult Yes  . Obesity   . Acanthosis nigricans   . History of PCOS   . History of abnormal cervical Pap smear   . High risk medication use   . Elevated blood pressure reading without diagnosis of hypertension   . Screening for cervical cancer   . Screen for STD (sexually transmitted disease)     Physical exam - discussed healthy lifestyle, diet, exercise, preventative care, vaccinations, and addressed their concerns.  Handout given. Routine fasting labs today, STD labs today.  Up-to-date on Tdap and flu vaccines.  Discussed cancer screening, Pap smear. She is doing self breast exams regularly. We will continue to keep an eye on her blood pressure this is the first time it has been elevated but she has several reasons it may be elevated today including stress, additional weight gain, and I suspect PCOS or impaired glucose Follow-up pending labs and additional recommendations

## 2014-08-29 ENCOUNTER — Other Ambulatory Visit: Payer: Self-pay | Admitting: Medical

## 2014-08-29 LAB — HEMOGLOBIN A1C
HEMOGLOBIN A1C: 6.4 % — AB (ref <5.7)
MEAN PLASMA GLUCOSE: 137 mg/dL — AB (ref <117)

## 2014-08-29 LAB — CYTOLOGY - PAP

## 2014-08-29 LAB — TSH: TSH: 2.295 u[IU]/mL (ref 0.350–4.500)

## 2014-08-29 LAB — RPR

## 2014-08-29 LAB — HIV ANTIBODY (ROUTINE TESTING W REFLEX): HIV: NONREACTIVE

## 2014-08-29 MED ORDER — CANAGLIFLOZIN-METFORMIN HCL 50-500 MG PO TABS
1.0000 | ORAL_TABLET | Freq: Two times a day (BID) | ORAL | Status: DC
Start: 2014-08-29 — End: 2015-03-05

## 2014-09-05 ENCOUNTER — Other Ambulatory Visit: Payer: Self-pay | Admitting: Family Medicine

## 2014-09-05 DIAGNOSIS — R87629 Unspecified abnormal cytological findings in specimens from vagina: Secondary | ICD-10-CM

## 2014-09-13 ENCOUNTER — Other Ambulatory Visit: Payer: Self-pay | Admitting: Obstetrics and Gynecology

## 2015-02-27 ENCOUNTER — Encounter: Payer: Self-pay | Admitting: Family Medicine

## 2015-03-05 ENCOUNTER — Encounter: Payer: Self-pay | Admitting: Family Medicine

## 2015-03-05 ENCOUNTER — Ambulatory Visit (INDEPENDENT_AMBULATORY_CARE_PROVIDER_SITE_OTHER): Payer: 59 | Admitting: Family Medicine

## 2015-03-05 VITALS — BP 130/76 | HR 72 | Temp 98.4°F | Resp 14 | Ht 64.0 in | Wt 255.0 lb

## 2015-03-05 DIAGNOSIS — E669 Obesity, unspecified: Secondary | ICD-10-CM | POA: Diagnosis not present

## 2015-03-05 DIAGNOSIS — Z3200 Encounter for pregnancy test, result unknown: Secondary | ICD-10-CM

## 2015-03-05 DIAGNOSIS — Z8742 Personal history of other diseases of the female genital tract: Secondary | ICD-10-CM

## 2015-03-05 DIAGNOSIS — Z113 Encounter for screening for infections with a predominantly sexual mode of transmission: Secondary | ICD-10-CM | POA: Diagnosis not present

## 2015-03-05 DIAGNOSIS — L83 Acanthosis nigricans: Secondary | ICD-10-CM | POA: Diagnosis not present

## 2015-03-05 DIAGNOSIS — R7302 Impaired glucose tolerance (oral): Secondary | ICD-10-CM

## 2015-03-05 LAB — CBC WITH DIFFERENTIAL/PLATELET
BASOS ABS: 0 10*3/uL (ref 0.0–0.1)
Basophils Relative: 1 % (ref 0–1)
EOS ABS: 0.1 10*3/uL (ref 0.0–0.7)
EOS PCT: 3 % (ref 0–5)
HCT: 36.6 % (ref 36.0–46.0)
Hemoglobin: 12 g/dL (ref 12.0–15.0)
Lymphocytes Relative: 46 % (ref 12–46)
Lymphs Abs: 2.3 10*3/uL (ref 0.7–4.0)
MCH: 27.5 pg (ref 26.0–34.0)
MCHC: 32.8 g/dL (ref 30.0–36.0)
MCV: 83.9 fL (ref 78.0–100.0)
MONO ABS: 0.3 10*3/uL (ref 0.1–1.0)
MPV: 10.1 fL (ref 8.6–12.4)
Monocytes Relative: 7 % (ref 3–12)
Neutro Abs: 2.1 10*3/uL (ref 1.7–7.7)
Neutrophils Relative %: 43 % (ref 43–77)
PLATELETS: 250 10*3/uL (ref 150–400)
RBC: 4.36 MIL/uL (ref 3.87–5.11)
RDW: 15 % (ref 11.5–15.5)
WBC: 4.9 10*3/uL (ref 4.0–10.5)

## 2015-03-05 LAB — HEMOGLOBIN A1C
Hgb A1c MFr Bld: 6.3 % — ABNORMAL HIGH (ref <5.7)
MEAN PLASMA GLUCOSE: 134 mg/dL — AB (ref <117)

## 2015-03-05 LAB — COMPREHENSIVE METABOLIC PANEL
ALBUMIN: 3.6 g/dL (ref 3.6–5.1)
ALT: 12 U/L (ref 6–29)
AST: 16 U/L (ref 10–30)
Alkaline Phosphatase: 61 U/L (ref 33–115)
BILIRUBIN TOTAL: 0.3 mg/dL (ref 0.2–1.2)
BUN: 11 mg/dL (ref 7–25)
CO2: 28 mmol/L (ref 20–31)
CREATININE: 0.78 mg/dL (ref 0.50–1.10)
Calcium: 8.9 mg/dL (ref 8.6–10.2)
Chloride: 104 mmol/L (ref 98–110)
Glucose, Bld: 87 mg/dL (ref 70–99)
Potassium: 3.9 mmol/L (ref 3.5–5.3)
SODIUM: 139 mmol/L (ref 135–146)
TOTAL PROTEIN: 6.8 g/dL (ref 6.1–8.1)

## 2015-03-05 LAB — PREGNANCY, URINE: PREG TEST UR: NEGATIVE

## 2015-03-05 LAB — WET PREP FOR TRICH, YEAST, CLUE
TRICH WET PREP: NONE SEEN
Yeast Wet Prep HPF POC: NONE SEEN

## 2015-03-05 MED ORDER — FLUCONAZOLE 150 MG PO TABS: 150.0000 mg | ORAL_TABLET | ORAL | Status: DC

## 2015-03-05 MED ORDER — PHENTERMINE HCL 15 MG PO TBDP: ORAL_TABLET | ORAL | Status: DC

## 2015-03-05 NOTE — Assessment & Plan Note (Signed)
Her fasting labs are rechecked today. I am concerned her back in phentermine on 15 mg once a day which she has tolerated in the past. She's had some insurance coverage with some of the other medications. I'm also referring her to a nutritionist.

## 2015-03-05 NOTE — Patient Instructions (Addendum)
Release of records- Dr. Normand Sloop - OB/GYN Start phentermine take  once a day We will call with lab results Referral to nutritionist F/U 6 weeks

## 2015-03-05 NOTE — Progress Notes (Signed)
Patient ID: Abigail Duran, female   DOB: [birth year], 39 y.o.   MRN: [id number]   Subjective:    Patient ID: Abigail Duran, female    DOB: 12-24-1975, 39 y.o.   MRN: [id number]  Patient presents for [facility] and Pregnancy Test  patient to establish care. She is history of obesity along with history of polycystic ovarian syndrome per her records. She's had irregular periods for quite some time she's also been obese for most of her adult life and has had some mild hirsutism. Her last primary care provider appointment a few months ago her A1c was borderline diabetic at 6.4% she was started on Invokamet 50-500mg  twice a day but this made her very sick to the stomach therefore she discontinued this. She was also given phentermine which she had been on in the past when she was going to a weight loss clinic and she lost about 60 pounds at that time. The phentermine does however blows 37.5 mg when she first started a few months ago and this made her sick therefore she stopped taking this as well. She tries to walk at least 2 miles a day and tries to monitor her eating habits but she has gained 20 pounds in the past 4 months.  She requested a pregnancy test as well as STD check. Her period was on August 29 however it was very irregular and she has been sexually active. He denies any vaginal discharge or dysuria but wanted a screening done.  Family history medications were reviewed.  GYN- Dr. Normand Sloop   Works at [employer] Immunizations UTD   Review Of Systems:  GEN- denies fatigue, fever, weight loss,weakness, recent illness HEENT- denies eye drainage, change in vision, nasal discharge, CVS- denies chest pain, palpitations RESP- denies SOB, cough, wheeze ABD- denies N/V, change in stools, abd pain GU- denies dysuria, hematuria, dribbling, incontinence MSK- denies joint pain, muscle aches, injury Neuro- denies headache, dizziness, syncope, seizure activity       Objective:    BP  130/76 mmHg  Pulse 72  Temp(Src) 98.4 F (36.9 C) (Oral)  Resp 14  Ht 5\' 4"  (1.626 m)  Wt 255 lb (115.667 kg)  BMI 43.75 kg/m2  LMP 02/25/2015 GEN- NAD, alert and oriented x3 HEENT- PERRL, EOMI, non injected sclera, pink conjunctiva, MMM, oropharynx clear Neck- Supple, no thyromegaly, acanthosis CVS- RRR, no murmur RESP-CTAB ABD-NABS,soft,NT,ND GU- normal external genitalia, vaginal mucosa pink and moist, cervix visualized no growth, no blood form os, minimal thin clear discharge, no CMT, no ovarian masses, uterus normal size Skin- hypopigmented macile EXT- No edema Pulses- Radial, DP- 2+        Assessment & Plan:      Problem List Items Addressed This Visit    Obesity   Relevant Medications   Phentermine HCl 15 MG TBDP   History of PCOS   Relevant Orders   FSH/LH   Glucose intolerance (impaired glucose tolerance)   Relevant Orders   Comprehensive metabolic panel   CBC with Differential/Platelet   Hemoglobin A1c   Acanthosis nigricans    Other Visit Diagnoses    Encounter for pregnancy test    -  Primary    Upreg neg, check labs, based on exam, history, obesity, I think she does have PCOS and would benefit from low dose MTF    Relevant Orders    Pregnancy, urine (Completed)    FSH/LH    Screen for STD (sexually transmitted disease)  Relevant Orders    WET PREP FOR TRICH, YEAST, CLUE (Completed)    GC/Chlamydia Probe Amp    HIV antibody (with reflex)       Note: This dictation was prepared with Dragon dictation along with smaller phrase technology. Any transcriptional errors that result from this process are unintentional.

## 2015-03-06 LAB — GC/CHLAMYDIA PROBE AMP
CT Probe RNA: NEGATIVE
GC Probe RNA: NEGATIVE

## 2015-03-06 LAB — FSH/LH
FSH: 6.8 m[IU]/mL
LH: 4 m[IU]/mL

## 2015-03-06 LAB — HIV ANTIBODY (ROUTINE TESTING W REFLEX): HIV: NONREACTIVE

## 2015-03-07 ENCOUNTER — Other Ambulatory Visit: Payer: Self-pay | Admitting: *Deleted

## 2015-03-07 MED ORDER — METFORMIN HCL 500 MG PO TABS: 500.0000 mg | ORAL_TABLET | Freq: Every day | ORAL | Status: DC

## 2015-04-16 ENCOUNTER — Ambulatory Visit: Payer: 59 | Admitting: Family Medicine

## 2015-04-24 ENCOUNTER — Ambulatory Visit (INDEPENDENT_AMBULATORY_CARE_PROVIDER_SITE_OTHER): Payer: 59 | Admitting: Family Medicine

## 2015-04-24 ENCOUNTER — Encounter: Payer: Self-pay | Admitting: Family Medicine

## 2015-04-24 ENCOUNTER — Telehealth: Payer: Self-pay | Admitting: *Deleted

## 2015-04-24 VITALS — BP 128/64 | HR 70 | Temp 98.6°F | Resp 14 | Ht 64.0 in | Wt 252.0 lb

## 2015-04-24 DIAGNOSIS — J069 Acute upper respiratory infection, unspecified: Secondary | ICD-10-CM

## 2015-04-24 DIAGNOSIS — B360 Pityriasis versicolor: Secondary | ICD-10-CM

## 2015-04-24 LAB — RAPID STREP SCREEN (MED CTR MEBANE ONLY): Streptococcus, Group A Screen (Direct): NEGATIVE

## 2015-04-24 MED ORDER — BENZONATATE 100 MG PO CAPS: 100.0000 mg | ORAL_CAPSULE | Freq: Three times a day (TID) | ORAL | Status: DC | PRN

## 2015-04-24 MED ORDER — AZITHROMYCIN 250 MG PO TABS: ORAL_TABLET | ORAL | Status: DC

## 2015-04-24 MED ORDER — FLUCONAZOLE 150 MG PO TABS: 150.0000 mg | ORAL_TABLET | ORAL | Status: DC

## 2015-04-24 NOTE — Progress Notes (Signed)
Patient ID: Malena EdmanSonya S Gerads, female   DOB: [birth year], 39 y.o.   MRN: [id number]   Subjective:    Patient ID: Malena EdmanSonya S Windish, female    DOB: [birth year], 39 y.o.   MRN: [id number]  Patient presents for Illness  patient here with sore throat cough worsening over the past 5 days. Sick contacts at work. Subjective fever and chills. She's had her shot. She's been taking over-the-counter cough and cold medicines ibuprofen which helped her throat. No history of any allergy. She is a nonsmoker She also notes that she still has some spots or chest tinea versicolor on her arm and around. She has taken Diflucan for 4 weeks.   Review Of Systems:  GEN- denies fatigue, fever, weight loss,weakness, recent illness HEENT- denies eye drainage, change in vision, +nasal discharge, CVS- denies chest pain, palpitations RESP- denies SOB,+ cough, wheeze ABD- denies N/V, change in stools, abd pain Neuro- denies headache, dizziness, syncope, seizure activity       Objective:    BP 128/64 mmHg  Pulse 70  Temp(Src) 98.6 F (37 C) (Oral)  Resp 14  Ht 5\' 4"  (1.626 m)  Wt 252 lb (114.306 kg)  BMI 43.23 kg/m2 GEN- NAD, alert and oriented x3 HEENT- PERRL, EOMI, non injected sclera, pink conjunctiva, MMM, oropharynx mild injection, TM clear bilat no effusion,  No  maxillary sinus tenderness, nares clear Neck- Supple, no LAD CVS- RRR, no murmur RESP-CTAB EXT- No edema Pulses- Radial 2+  Strep-negative        Assessment & Plan:      Problem List Items Addressed This Visit    Tinea versicolor    Skin improved with use of the Diflucan will extend for 4 weeks she still has spots on her chest       Other Visit Diagnoses    Acute URI    -  Primary    ZPAK,MUCINEX, TESSALON PERRLES    Relevant Medications    azithromycin (ZITHROMAX) 250 MG tablet    fluconazole (DIFLUCAN) 150 MG tablet    Other Relevant Orders    Rapid Strep Screen (Completed)       Note: This dictation was prepared with Dragon  dictation along with smaller phrase technology. Any transcriptional errors that result from this process are unintentional.

## 2015-04-24 NOTE — Assessment & Plan Note (Signed)
Skin improved with use of the Diflucan will extend for 4 weeks she still has spots on her chest

## 2015-04-24 NOTE — Telephone Encounter (Signed)
Received call from patient.   Reports that she has been sick for the past 5 days with sore throat and body aches. Requested ABTx. [facility].   Advised that last OV was in Sept, at which time she had no Sx, so PCP would need to see patient to prescribe ABTx.   At this time it in uncertain if the office phone disconnected or if patient hung up. Was unable to give advise on OTC medication.

## 2015-04-24 NOTE — Patient Instructions (Addendum)
Mucinex twice a day  Take antibiotics Give Work not for today  Tessalon for cough  F/U as previous

## 2015-07-29 ENCOUNTER — Ambulatory Visit: Payer: 59 | Admitting: Family Medicine

## 2015-08-06 ENCOUNTER — Encounter: Payer: Self-pay | Admitting: Family Medicine

## 2015-08-06 ENCOUNTER — Ambulatory Visit (INDEPENDENT_AMBULATORY_CARE_PROVIDER_SITE_OTHER): Payer: 59 | Admitting: Family Medicine

## 2015-08-06 VITALS — BP 122/64 | HR 80 | Temp 98.9°F | Resp 14 | Ht 64.0 in | Wt 256.0 lb

## 2015-08-06 DIAGNOSIS — R7302 Impaired glucose tolerance (oral): Secondary | ICD-10-CM

## 2015-08-06 DIAGNOSIS — Z8742 Personal history of other diseases of the female genital tract: Secondary | ICD-10-CM | POA: Diagnosis not present

## 2015-08-06 LAB — LIPID PANEL
CHOL/HDL RATIO: 3.7 ratio (ref <=5.0)
CHOLESTEROL: 187 mg/dL (ref 125–200)
HDL: 51 mg/dL (ref >=46)
LDL Cholesterol: 116 mg/dL (ref <130)
TRIGLYCERIDES: 101 mg/dL (ref <150)
VLDL: 20 mg/dL (ref <30)

## 2015-08-06 LAB — BASIC METABOLIC PANEL
BUN: 16 mg/dL (ref 7–25)
CHLORIDE: 104 mmol/L (ref 98–110)
CO2: 27 mmol/L (ref 20–31)
Calcium: 8.9 mg/dL (ref 8.6–10.2)
Creat: 0.78 mg/dL (ref 0.50–1.10)
Glucose, Bld: 110 mg/dL — ABNORMAL HIGH (ref 70–99)
POTASSIUM: 4.7 mmol/L (ref 3.5–5.3)
SODIUM: 140 mmol/L (ref 135–146)

## 2015-08-06 NOTE — Progress Notes (Signed)
Patient ID: Abigail Duran, female   DOB: 02/28/1976, 40 y.o.   MRN: [id number]    Subjective:    Patient ID: Abigail Duran, female    DOB: Mar 29, 1976, 40 y.o.   MRN: [id number]  Patient presents for F/U  patient here for follow-up. She stopped the phentermine because she had palpitations and insomnia. She has taken his medicine in the past however even with decreasing the dose she still has symptoms. She would like to proceed with bariatric weight loss surgery. She has been looking at the pouch. She's tried multiple modalities for weight loss over the past 8 years. Her heaviest weight was in 2009 she was 280 pounds her BMI at that time was near 50. She is currently 2 and 56 pounds with a BMI of 43. Her lowest weight was in 2015 she got down to 215 pounds at that time she was using phentermine and also following with the nutritionist and exercising. She still exercises by walking and positive for half miles at least 5 days a week. She works in [employer], [employer]/film/video as well. She's tried Weight Watchers she is try low-carb diets she has tried weight loss groups and has never been able to maintain her weight loss. She has risk factors of borderline diabetes PCO S family history of diabetes and heart disease.    Review Of Systems:  GEN- denies fatigue, fever, weight loss,weakness, recent illness HEENT- denies eye drainage, change in vision, nasal discharge, CVS- denies chest pain, palpitations RESP- denies SOB, cough, wheeze ABD- denies N/V, change in stools, abd pain GU- denies dysuria, hematuria, dribbling, incontinence MSK- denies joint pain, muscle aches, injury Neuro- denies headache, dizziness, syncope, seizure activity       Objective:    BP 122/64 mmHg  Pulse 80  Temp(Src) 98.9 F (37.2 C) (Oral)  Resp 14  Ht  (1.626 m)  Wt 256 lb (116.121 kg)  BMI 43.92 kg/m2  LMP 07/22/2015 (Approximate) GEN- NAD, alert and oriented x3 HEENT- PERRL, EOMI, non injected sclera, pink conjunctiva, MMM,  oropharynx clear Neck- Supple, no thyromegaly CVS- RRR, no murmur RESP-CTAB EXT- No edema Pulses- Radial - 2+        Assessment & Plan:      Problem List Items Addressed This Visit    Morbid obesity (HCC) - Primary    Recheck A1c previous 6.3%. She will benefit from bariatric surgery to reduce her risk factors for heart disease stroke which would lead to an early death. She's tried multiple modalities in the past she overall has fairly good health and think would be a good surgical candidate. I will start him a referral for her. We also discussed that she would need to attend a seminar.      Relevant Orders   Lipid panel   Ambulatory referral to General Surgery   Glucose intolerance (impaired glucose tolerance)   Relevant Orders   Basic metabolic panel   Hemoglobin A1c   Lipid panel      Note: This dictation was prepared with Dragon dictation along with smaller phrase technology. Any transcriptional errors that result from this process are unintentional.

## 2015-08-06 NOTE — Patient Instructions (Addendum)
Referral to bariatric clinic We will call with lab results F/U 6 months Physical

## 2015-08-06 NOTE — Assessment & Plan Note (Signed)
Recheck A1c previous 6.3%. She will benefit from bariatric surgery to reduce her risk factors for heart disease stroke which would lead to an early death. She's tried multiple modalities in the past she overall has fairly good health and think would be a good surgical candidate. I will start him a referral for her. We also discussed that she would need to attend a seminar.

## 2015-08-07 LAB — HEMOGLOBIN A1C
Hgb A1c MFr Bld: 6.5 % — ABNORMAL HIGH (ref <5.7)
Mean Plasma Glucose: 140 mg/dL — ABNORMAL HIGH (ref <117)

## 2015-08-08 ENCOUNTER — Other Ambulatory Visit: Payer: Self-pay | Admitting: *Deleted

## 2015-08-08 MED ORDER — BLOOD GLUCOSE SYSTEM PAK KIT: PACK | Status: DC

## 2015-08-08 MED ORDER — LANCETS MISC: Status: DC

## 2015-08-08 MED ORDER — BLOOD GLUCOSE TEST VI STRP: ORAL_STRIP | Status: DC

## 2015-09-02 ENCOUNTER — Encounter: Payer: Self-pay | Admitting: Family Medicine

## 2015-09-09 ENCOUNTER — Other Ambulatory Visit (HOSPITAL_COMMUNITY): Payer: Self-pay | Admitting: General Surgery

## 2015-09-18 ENCOUNTER — Ambulatory Visit (HOSPITAL_COMMUNITY): Admission: RE | Admit: 2015-09-18 | Payer: Commercial Indemnity | Source: Ambulatory Visit

## 2015-09-18 ENCOUNTER — Ambulatory Visit (HOSPITAL_COMMUNITY): Payer: Commercial Indemnity | Attending: General Surgery

## 2015-09-27 ENCOUNTER — Encounter: Payer: Self-pay | Admitting: Dietician

## 2015-09-27 ENCOUNTER — Encounter: Payer: 59 | Attending: General Surgery | Admitting: Dietician

## 2015-09-27 DIAGNOSIS — Z713 Dietary counseling and surveillance: Secondary | ICD-10-CM | POA: Diagnosis not present

## 2015-09-27 NOTE — Patient Instructions (Signed)
Follow Pre-Op Goals Try Protein Shakes Call [facility] at [phone] when surgery is scheduled to enroll in Pre-Op Class  Things to remember:  Please always be honest with us. We want to support you!  If you have any questions or concerns in between appointments, please call or email Liz, Raychel Dowler, or Laurie.  The diet after surgery will be high protein and low in carbohydrate.  Vitamins and calcium need to be taken for the rest of your life.  Feel free to include support people in any classes or appointments.   Supplement recommendations:  "Complete" Multivitamin: Sleeve Gastrectomy and RYGB patients take a double dose of MVI. Vitamin must be liquid or chewable but not gummy. Examples of these include Flintstones Complete and Centrum Complete. If the vitamin is bariatric-specific, take 1 dose as it is already formulated for bariatric surgery patients. Examples of these are Bariatric Advantage, Celebrate, and Wellesse. These can be found at the [facility] and/or online.     Calcium citrate: 1500 mg/day of Calcium citrate (also chewable or liquid) is recommended for all procedures. The body is only able to absorb 500-600 mg of Calcium at one time so 3 daily doses of 500 mg are recommended. Calcium doses must be taken a minimum of 2 hours apart. Additionally, Calcium must be taken 2 hours apart from iron-containing MVI. Examples of brands include Celebrate, Bariatric Advantage, and Wellesse. These brands must be purchased online or at the [facility]. Citracal Petites is the only Calcium citrate supplement found in general grocery stores and pharmacies. This is in tablet form and may be recommended for patients who do not tolerate chewable Calcium.  Continued or added Vitamin D supplementation based on individual needs.    Vitamin B12: 300-500 mcg/day for Sleeve Gastrectomy and RYGB. Must be taken intramuscularly, sublingually, or inhaled nasally. Oral route  is not recommended.  

## 2015-09-27 NOTE — Progress Notes (Signed)
  Pre-Op Assessment Visit:  Pre-Operative Sleeve Gastrectomy Surgery  Medical Nutrition Therapy:  Appt start time: 0925   End time:  1010.  Patient was seen on 09/27/2015 for Pre-Operative Nutrition Assessment. Assessment and letter of approval [facility] coordinator on 09/27/2015.   Preferred Learning Style:   No preference indicated   Learning Readiness:   Ready  Handouts given during visit include:  Pre-Op Goals Bariatric Surgery Protein Shakes   During the appointment today the following Pre-Op Goals were reviewed with the patient: Maintain or lose weight as instructed by your surgeon Make healthy food choices Begin to limit portion sizes Limited concentrated sugars and fried foods Keep fat/sugar in the single digits per serving on   food labels Practice CHEWING your food  (aim for 30 chews per bite or until applesauce consistency) Practice not drinking 15 minutes before, during, and 30 minutes after each meal/snack Avoid all carbonated beverages  Avoid/limit caffeinated beverages  Avoid all sugar-sweetened beverages Consume 3 meals per day; eat every 3-5 hours Make a list of non-food related activities Aim for 64-100 ounces of FLUID daily  Aim for at least 60-80 grams of PROTEIN daily Look for a liquid protein source that contain ?15 g protein and ?5 g carbohydrate  (ex: shakes, drinks, shots)  Demonstrated degree of understanding via:  Teach Back  Teaching Method Utilized:  Visual Auditory Hands on  Barriers to learning/adherence to lifestyle change: none  [facility] to [facility] to enroll in Pre-Op and Post-Op Nutrition Education when surgery date is scheduled.

## 2015-10-02 ENCOUNTER — Other Ambulatory Visit (HOSPITAL_COMMUNITY): Payer: Self-pay | Admitting: General Surgery

## 2015-10-09 ENCOUNTER — Ambulatory Visit (HOSPITAL_COMMUNITY)
Admission: RE | Admit: 2015-10-09 | Discharge: 2015-10-09 | Disposition: A | Discharge status: Home / Self Care | Payer: 59 | Source: Ambulatory Visit | Attending: General Surgery | Admitting: General Surgery

## 2015-10-09 DIAGNOSIS — Z01818 Encounter for other preprocedural examination: Secondary | ICD-10-CM | POA: Diagnosis not present

## 2015-10-10 ENCOUNTER — Ambulatory Visit: Payer: Commercial Indemnity | Admitting: Dietician

## 2015-10-18 ENCOUNTER — Telehealth: Payer: Self-pay | Admitting: Family Medicine

## 2015-10-18 NOTE — Telephone Encounter (Signed)
Patient needs to be seen.

## 2015-10-18 NOTE — Telephone Encounter (Signed)
Patient says her skin is starting to splotch again like it did last year, would like to know if she needs to come in or can something be called in for her? [phone] (H)

## 2015-11-12 DIAGNOSIS — Z833 Family history of diabetes mellitus: Secondary | ICD-10-CM | POA: Diagnosis not present

## 2015-12-23 ENCOUNTER — Ambulatory Visit: Payer: 59

## 2016-02-04 ENCOUNTER — Encounter: Payer: 59 | Admitting: Family Medicine

## 2016-03-11 ENCOUNTER — Ambulatory Visit (INDEPENDENT_AMBULATORY_CARE_PROVIDER_SITE_OTHER): Payer: 59 | Admitting: Family Medicine

## 2016-03-11 ENCOUNTER — Encounter: Payer: Self-pay | Admitting: Family Medicine

## 2016-03-11 VITALS — BP 126/74 | HR 98 | Temp 98.8°F | Resp 16 | Ht 63.0 in | Wt 266.0 lb

## 2016-03-11 DIAGNOSIS — R7302 Impaired glucose tolerance (oral): Secondary | ICD-10-CM | POA: Diagnosis not present

## 2016-03-11 DIAGNOSIS — B360 Pityriasis versicolor: Secondary | ICD-10-CM | POA: Diagnosis not present

## 2016-03-11 DIAGNOSIS — Z0000 Encounter for general adult medical examination without abnormal findings: Secondary | ICD-10-CM

## 2016-03-11 DIAGNOSIS — Z8742 Personal history of other diseases of the female genital tract: Secondary | ICD-10-CM

## 2016-03-11 LAB — CBC WITH DIFFERENTIAL/PLATELET
BASOS PCT: 0 %
Basophils Absolute: 0 cells/uL (ref 0–200)
EOS ABS: 177 {cells}/uL (ref 15–500)
Eosinophils Relative: 3 %
HCT: 38.1 % (ref 35.0–45.0)
Hemoglobin: 12.3 g/dL (ref 12.0–15.0)
LYMPHS PCT: 36 %
Lymphs Abs: 2124 cells/uL (ref 850–3900)
MCH: 27.2 pg (ref 27.0–33.0)
MCHC: 32.3 g/dL (ref 32.0–36.0)
MCV: 84.1 fL (ref 80.0–100.0)
MONOS PCT: 8 %
MPV: 10.2 fL (ref 7.5–12.5)
Monocytes Absolute: 472 cells/uL (ref 200–950)
Neutro Abs: 3127 cells/uL (ref 1500–7800)
Neutrophils Relative %: 53 %
PLATELETS: 239 10*3/uL (ref 140–400)
RBC: 4.53 MIL/uL (ref 3.80–5.10)
RDW: 15.3 % — AB (ref 11.0–15.0)
WBC: 5.9 10*3/uL (ref 3.8–10.8)

## 2016-03-11 MED ORDER — NALTREXONE-BUPROPION HCL ER 8-90 MG PO TB12: ORAL_TABLET | ORAL | 2 refills | Status: DC

## 2016-03-11 MED ORDER — CLOTRIMAZOLE-BETAMETHASONE 1-0.05 % EX CREA: 1.0000 "application " | TOPICAL_CREAM | Freq: Two times a day (BID) | CUTANEOUS | 0 refills | Status: DC

## 2016-03-11 NOTE — Progress Notes (Signed)
Subjective:    Patient ID: Abigail Duran, female    DOB: July 12, 1975, 40 y.o.   MRN: [id number]  Patient presents for Annual Exam (No pap has GYN - Pt NOT fasting - Gets  flu shot at work)  Pt here for CPE  PAP per GYN Immunizations- FLu shot at work     At her last visit for her morbid obesity she was referred to bariatric clinic she tried multiple dietary plans and weight loss medications. Her weight today is up 9 pounds since her last visit in March Glucose intolerance last A1C 6.5% Unfortunately completed all of the pulmonary workup as she is unable to afford the surgery and she has up front cost. She would like to try a different weight loss medication for now until she can meet the out-of-pocket cost  She also has recurrence of a scaly hyperpigmented rash intensive pop-up in the summertime on her arms and neck. In the past treated her for tinea with Diflucan and it did  clear but took quite some time. She states it is now itchy. It does not occur during the winter and fall only during the summer  Review Of Systems:  GEN- denies fatigue, fever, weight loss,weakness, recent illness HEENT- denies eye drainage, change in vision, nasal discharge, CVS- denies chest pain, palpitations RESP- denies SOB, cough, wheeze ABD- denies N/V, change in stools, abd pain GU- denies dysuria, hematuria, dribbling, incontinence MSK- denies joint pain, muscle aches, injury Neuro- denies headache, dizziness, syncope, seizure activity       Objective:    BP 126/74   Pulse 98   Temp 98.8 F (37.1 C) (Oral)   Resp 16   Ht 5\' 3"  (1.6 m)   Wt 266 lb (120.7 kg)   BMI 47.12 kg/m  GEN- NAD, alert and oriented x3,obese  HEENT- PERRL, EOMI, non injected sclera, pink conjunctiva, MMM, oropharynx clear Neck- Supple, no thyromegaly CVS- RRR, no murmur RESP-CTAB ABD-NABS,soft,NT,ND Skin- acanthosis around neck, eczematous hypogpimented maculues at anticubital fossa, few scaley patches on left upper  arm, left neck  EXT- No edema Pulses- Radial, DP- 2+        Assessment & Plan:      Problem List Items Addressed This Visit    Tinea versicolor    Her rash still appears to be some type of fungal infection based on the appearance. I will treat her with Lotrisone as the areas as smaller this time and not scattered across her chest and upper back.      Morbid obesity (HCC) - Primary    We will try her on contrary. Discussed side effects of the medication. She's been following the nutritionist guidelines the best of her ability. She even tried taking the protein shakes are recommended but could not stomach drinking on twice a day. She is also trying to exercise. We will have a follow-up in 6 weeks Nonfasting labs done today showed cholesterol February      Relevant Medications   Naltrexone-Bupropion HCl ER (CONTRAVE) 8-90 MG TB12   History of PCOS   Glucose intolerance (impaired glucose tolerance)   Relevant Orders   Hemoglobin A1c    Other Visit Diagnoses    Routine general medical examination at a health care facility       CPE, flu shot at work, history of Abnormal PAP CIN, will f/u with GYN for PAP Smears   Relevant Orders   CBC with Differential/Platelet   Comprehensive metabolic panel  Note: This dictation was prepared with Dragon dictation along with smaller phrase technology. Any transcriptional errors that result from this process are unintentional.

## 2016-03-11 NOTE — Patient Instructions (Addendum)
For contrave- Take 1 pill in morning for 1 week   Week 2- 1 pill in morning and 1 in evening  Week 3- 2 pills in morning and 1 in evening   Week 4 - 2 pills in morning and 2 in the evening Try the lotrisone cream  F/U 6 weeks

## 2016-03-11 NOTE — Assessment & Plan Note (Signed)
We will try her on contrary. Discussed side effects of the medication. She's been following the nutritionist guidelines the best of her ability. She even tried taking the protein shakes are recommended but could not stomach drinking on twice a day. She is also trying to exercise. We will have a follow-up in 6 weeks Nonfasting labs done today showed cholesterol February

## 2016-03-11 NOTE — Assessment & Plan Note (Signed)
Her rash still appears to be some type of fungal infection based on the appearance. I will treat her with Lotrisone as the areas as smaller this time and not scattered across her chest and upper back.

## 2016-03-12 LAB — COMPREHENSIVE METABOLIC PANEL
ALBUMIN: 3.8 g/dL (ref 3.6–5.1)
ALT: 18 U/L (ref 6–29)
AST: 14 U/L (ref 10–30)
Alkaline Phosphatase: 64 U/L (ref 33–115)
BUN: 17 mg/dL (ref 7–25)
CALCIUM: 9.1 mg/dL (ref 8.6–10.2)
CHLORIDE: 101 mmol/L (ref 98–110)
CO2: 25 mmol/L (ref 20–31)
CREATININE: 0.68 mg/dL (ref 0.50–1.10)
Glucose, Bld: 114 mg/dL — ABNORMAL HIGH (ref 70–99)
POTASSIUM: 4.3 mmol/L (ref 3.5–5.3)
Sodium: 138 mmol/L (ref 135–146)
TOTAL PROTEIN: 6.9 g/dL (ref 6.1–8.1)
Total Bilirubin: 0.2 mg/dL (ref 0.2–1.2)

## 2016-03-12 LAB — HEMOGLOBIN A1C
HEMOGLOBIN A1C: 6.4 % — AB (ref <5.7)
MEAN PLASMA GLUCOSE: 137 mg/dL

## 2016-04-29 ENCOUNTER — Ambulatory Visit: Payer: 59 | Admitting: Family Medicine

## 2016-06-16 ENCOUNTER — Encounter: Payer: Self-pay | Admitting: Family Medicine

## 2016-06-16 ENCOUNTER — Ambulatory Visit (INDEPENDENT_AMBULATORY_CARE_PROVIDER_SITE_OTHER): Payer: 59 | Admitting: Family Medicine

## 2016-06-16 VITALS — BP 118/74 | HR 76 | Temp 98.7°F | Resp 14 | Ht 63.0 in | Wt 252.0 lb

## 2016-06-16 DIAGNOSIS — B9689 Other specified bacterial agents as the cause of diseases classified elsewhere: Secondary | ICD-10-CM | POA: Diagnosis not present

## 2016-06-16 DIAGNOSIS — Z113 Encounter for screening for infections with a predominantly sexual mode of transmission: Secondary | ICD-10-CM

## 2016-06-16 DIAGNOSIS — N760 Acute vaginitis: Secondary | ICD-10-CM

## 2016-06-16 DIAGNOSIS — Z8742 Personal history of other diseases of the female genital tract: Secondary | ICD-10-CM | POA: Diagnosis not present

## 2016-06-16 DIAGNOSIS — N912 Amenorrhea, unspecified: Secondary | ICD-10-CM | POA: Diagnosis not present

## 2016-06-16 LAB — WET PREP FOR TRICH, YEAST, CLUE
Trich, Wet Prep: NONE SEEN
YEAST WET PREP: NONE SEEN

## 2016-06-16 LAB — PREGNANCY, URINE: Preg Test, Ur: NEGATIVE

## 2016-06-16 MED ORDER — METRONIDAZOLE 500 MG PO TABS: 500.0000 mg | ORAL_TABLET | Freq: Two times a day (BID) | ORAL | 0 refills | Status: DC

## 2016-06-16 NOTE — Patient Instructions (Signed)
We will call with lab results F/U pending results  

## 2016-06-16 NOTE — Progress Notes (Signed)
   Subjective:    Patient ID: Abigail Duran, female    DOB: 10/04/1975, 40 y.o.   MRN: [id number]  Patient presents for Possible Pregnancy and Vaginal Discharge  Patient here for STD screening. Her significant other has been unfaithful. She has had minimal vaginal discharge no itching or odor will like to be checked for everything. She also has PCO S but still has fairly regular cycles. She's not had a menstrual cycle since the beginning of November. She would like a pregnancy test. No other early pregnancy symptoms   Review Of Systems:  GEN- denies fatigue, fever, weight loss,weakness, recent illness HEENT- denies eye drainage, change in vision, nasal discharge, CVS- denies chest pain, palpitations RESP- denies SOB, cough, wheeze ABD- denies N/V, change in stools, abd pain GU- denies dysuria, hematuria, dribbling, incontinence MSK- denies joint pain, muscle aches, injury Neuro- denies headache, dizziness, syncope, seizure activity       Objective:    BP 118/74 (BP Location: Left Arm, Patient Position: Sitting, Cuff Size: Large)   Pulse 76   Temp 98.7 F (37.1 C) (Oral)   Resp 14   Ht 5\' 3"  (1.6 m)   Wt 252 lb (114.3 kg)   SpO2 97%   BMI 44.64 kg/m  GEN- NAD, alert and oriented x3 HEENT- PERRL, EOMI, non injected sclera, pink conjunctiva, MMM, oropharynx clear CVS- RRR, no murmur RESP-CTABs GU- normal external genitalia, vaginal mucosa pink and moist, cervix visualized no growth, no blood form os, minimal thin clear discharge, no CMT, no ovarian masses, uterus normal size         Assessment & Plan:      Problem List Items Addressed This Visit    History of PCOS    Other Visit Diagnoses    Screen for STD (sexually transmitted disease)    -  Primary   STD screen done, BV noted on wet prep, start flagyl   Relevant Orders   HIV antibody   GC/Chlamydia Probe Amp   WET PREP FOR TRICH, YEAST, CLUE (Completed)   RPR   Hepatitis C antibody   HSV(herpes simplex vrs)  1+2 ab-IgG   HSV(herpes simplex vrs) 1+2 ab-IgM   Other [facility] Test   Amenorrhea       likely stress atrributed to the change in cycle, Upreg negative in office, will just wait for now see if cycle starts    Relevant Orders   Pregnancy, urine (Completed)   BV (bacterial vaginosis)       Relevant Medications   metroNIDAZOLE (FLAGYL) 500 MG tablet      Note: This dictation was prepared with Dragon dictation along with smaller phrase technology. Any transcriptional errors that result from this process are unintentional.

## 2016-06-17 LAB — OTHER SOLSTAS TEST

## 2016-06-17 LAB — GC/CHLAMYDIA PROBE AMP
CT Probe RNA: NOT DETECTED
GC Probe RNA: NOT DETECTED

## 2016-06-17 LAB — HIV ANTIBODY (ROUTINE TESTING W REFLEX): HIV: NONREACTIVE

## 2016-06-17 LAB — RPR

## 2016-06-17 LAB — HEPATITIS C ANTIBODY: HCV AB: NEGATIVE

## 2016-06-20 LAB — HSV TYPE I/II IGG, IGMW/ REFLEX
HSV 1 IGM SCREEN: NEGATIVE
HSV 2 Glycoprotein G Ab, IgG: <0.9 Index (ref <0.90)
HSV 2 IGM SCREEN: NEGATIVE

## 2016-08-17 DIAGNOSIS — N62 Hypertrophy of breast: Secondary | ICD-10-CM | POA: Diagnosis not present

## 2016-11-13 ENCOUNTER — Encounter (HOSPITAL_COMMUNITY): Payer: Self-pay

## 2016-11-13 ENCOUNTER — Telehealth: Payer: Self-pay

## 2016-11-13 ENCOUNTER — Ambulatory Visit (HOSPITAL_COMMUNITY)
Admission: EM | Admit: 2016-11-13 | Discharge: 2016-11-13 | Disposition: A | Discharge status: Home / Self Care | Payer: 59 | Attending: Internal Medicine | Admitting: Internal Medicine

## 2016-11-13 DIAGNOSIS — Z8249 Family history of ischemic heart disease and other diseases of the circulatory system: Secondary | ICD-10-CM | POA: Insufficient documentation

## 2016-11-13 DIAGNOSIS — Z811 Family history of alcohol abuse and dependence: Secondary | ICD-10-CM | POA: Diagnosis not present

## 2016-11-13 DIAGNOSIS — Z885 Allergy status to narcotic agent status: Secondary | ICD-10-CM | POA: Diagnosis not present

## 2016-11-13 DIAGNOSIS — R51 Headache: Secondary | ICD-10-CM | POA: Diagnosis not present

## 2016-11-13 DIAGNOSIS — R69 Illness, unspecified: Secondary | ICD-10-CM | POA: Diagnosis not present

## 2016-11-13 DIAGNOSIS — M791 Myalgia: Secondary | ICD-10-CM | POA: Diagnosis not present

## 2016-11-13 DIAGNOSIS — Z825 Family history of asthma and other chronic lower respiratory diseases: Secondary | ICD-10-CM | POA: Diagnosis not present

## 2016-11-13 DIAGNOSIS — R5383 Other fatigue: Secondary | ICD-10-CM | POA: Diagnosis not present

## 2016-11-13 DIAGNOSIS — Z833 Family history of diabetes mellitus: Secondary | ICD-10-CM | POA: Diagnosis not present

## 2016-11-13 DIAGNOSIS — R509 Fever, unspecified: Secondary | ICD-10-CM

## 2016-11-13 DIAGNOSIS — J3489 Other specified disorders of nose and nasal sinuses: Secondary | ICD-10-CM | POA: Diagnosis not present

## 2016-11-13 DIAGNOSIS — J111 Influenza due to unidentified influenza virus with other respiratory manifestations: Secondary | ICD-10-CM | POA: Diagnosis not present

## 2016-11-13 DIAGNOSIS — R630 Anorexia: Secondary | ICD-10-CM | POA: Diagnosis not present

## 2016-11-13 DIAGNOSIS — R0981 Nasal congestion: Secondary | ICD-10-CM | POA: Diagnosis not present

## 2016-11-13 MED ORDER — OSELTAMIVIR PHOSPHATE 75 MG PO CAPS: 75.0000 mg | ORAL_CAPSULE | Freq: Two times a day (BID) | ORAL | 0 refills | Status: DC

## 2016-11-13 NOTE — Discharge Instructions (Signed)
You most likely have a viral URI like influenza or an influenza like illness, I advise rest, plenty of fluids and management of symptoms with over the counter medicines. For symptoms you may take Tylenol as needed every 4-6 hours for body aches or fever, not to exceed 4,000 mg a day, Take mucinex or mucinex DM ever 12 hours with a full glass of water, you may use an inhaled steroid such as Flonase, 2 sprays each nostril once a day for congestion, or an antihistamine such as Claritin or Zyrtec once a day. For treatment of influenza, I have prescribed Tamiflu. Take 1 tablet twice a day for 5 days. Should your symptoms worsen or fail to resolve, follow up with your primary care provider or return to clinic.

## 2016-11-13 NOTE — ED Provider Notes (Signed)
CSN: [id number]     Arrival date & time 11/13/16  1449 History   First MD Initiated Contact with Patient 11/13/16 1552     Chief Complaint  Patient presents with  . Fever   (Consider location/radiation/quality/duration/timing/severity/associated sxs/prior Treatment) The history is provided by the patient.  URI  Presenting symptoms: congestion, cough, fatigue, fever and rhinorrhea   Congestion:    Location:  Nasal Cough:    Cough characteristics:  Non-productive, dry and hacking   Sputum characteristics:  Clear   Severity:  Mild Fatigue:    Severity:  Moderate   Duration:  2 days   Timing:  Constant   Progression:  Worsening Severity:  Moderate Onset quality:  Gradual Duration:  2 days Timing:  Constant Progression:  Worsening Chronicity:  New Relieved by:  Nothing Worsened by:  Nothing Ineffective treatments:  None tried Associated symptoms: headaches and myalgias   Associated symptoms: no arthralgias, no neck pain, no sinus pain, no sneezing, no swollen glands and no wheezing     Past Medical History:  Diagnosis Date  . CIN I (cervical intraepithelial neoplasia I) 07/30/2010  . History of PCOS 2008  . History of varicella   . Irregular periods/menstrual cycles 12/14/06  . LGSIL (low grade squamous intraepithelial lesion) on Pap smear 08/13/09   colpo  . Obesity   . Wears glasses   . Yeast infection    hx/o   Past Surgical History:  Procedure Laterality Date  . COLPOSCOPY  2010   Family History  Problem Relation Age of Onset  . Asthma Mother   . Depression Mother   . Hypertension Mother   . Other Mother        back surgery  . COPD Mother   . Alcohol abuse Father   . Early death Father   . Hypertension Father   . Cirrhosis Father        died of cirrhosis  . Sickle cell trait Brother   . Hypertension Brother   . Heart disease Maternal Grandmother   . Diabetes Sister   . Lupus Sister   . Cancer Neg Hx   . Stroke Neg Hx    Social History  Substance  Use Topics  . Smoking status: Never Smoker  . Smokeless tobacco: Never Used  . Alcohol use No     Comment: occasionally    OB History    No data available     Review of Systems  Constitutional: Positive for appetite change, chills, fatigue and fever.  HENT: Positive for congestion and rhinorrhea. Negative for sinus pain and sneezing.   Respiratory: Positive for cough. Negative for wheezing.   Cardiovascular: Negative.   Gastrointestinal: Negative.   Genitourinary: Negative.   Musculoskeletal: Positive for myalgias. Negative for arthralgias and neck pain.  Skin: Negative.   Neurological: Positive for headaches. Negative for light-headedness.    Allergies  Codeine  Home Medications   Prior to Admission medications   Medication Sig Start Date End Date Taking? Authorizing Provider  Blood Glucose Monitoring Suppl (BLOOD GLUCOSE SYSTEM PAK) KIT Use to monitor FSBS 1x daily. Dx: E11.9 08/08/15   Alycia Rossetti, MD  clotrimazole-betamethasone (LOTRISONE) cream Apply 1 application topically 2 (two) times daily. 03/11/16   [place], Modena Nunnery, MD  Glucose Blood (BLOOD GLUCOSE TEST STRIPS) STRP Use to monitor FSBS 1x daily. Dx: E11.9 08/08/15   Alycia Rossetti, MD  Lancets MISC Use to monitor FSBS 1x daily. Dx: E11.9 08/08/15   Alycia Rossetti, MD  metroNIDAZOLE (FLAGYL) 500 MG tablet Take 1 tablet (500 mg total) by mouth 2 (two) times daily. 06/16/16   Alycia Rossetti, MD  oseltamivir (TAMIFLU) 75 MG capsule Take 1 capsule (75 mg total) by mouth every 12 (twelve) hours. 11/13/16   Barnet Glasgow, NP   Meds Ordered and Administered this Visit  Medications - No data to display  BP 126/76 (BP Location: Right Arm)   Pulse (!) 115   Temp 100.2 F (37.9 C) (Oral)   Resp 20   LMP 10/17/2016 (Exact Date)   SpO2 99%  No data found.   Physical Exam  Constitutional: She is oriented to person, place, and time. She appears well-developed and well-nourished. She appears ill. No distress.   HENT:  Head: Normocephalic and atraumatic.  Right Ear: Tympanic membrane and external ear normal.  Left Ear: Tympanic membrane and external ear normal.  Nose: Nose normal. Right sinus exhibits no maxillary sinus tenderness and no frontal sinus tenderness. Left sinus exhibits no maxillary sinus tenderness and no frontal sinus tenderness.  Mouth/Throat: Uvula is midline and oropharynx is clear and moist. No oropharyngeal exudate.  Eyes: Conjunctivae are normal. Pupils are equal, round, and reactive to light.  Neck: Normal range of motion. Neck supple. No JVD present.  Cardiovascular: Normal rate and regular rhythm.   Pulmonary/Chest: Effort normal and breath sounds normal. No respiratory distress. She has no wheezes.  Abdominal: Soft. Bowel sounds are normal. She exhibits no distension. There is no tenderness. There is no guarding.  Lymphadenopathy:    She has no cervical adenopathy.  Neurological: She is alert and oriented to person, place, and time.  Skin: Skin is warm and dry. Capillary refill takes less than 2 seconds. She is not diaphoretic.  Psychiatric: She has a normal mood and affect.  Nursing note and vitals reviewed.   Urgent Care Course     Procedures (including critical care time)  Labs Review Labs Reviewed - No data to display  Imaging Review No results found.    MDM   1. Influenza-like illness    Viral illness consistent with flu. Started on Tamiflu, provided counseling on OTC therapies for symptom relief, provided work note. Return to clinic as needed.    Barnet Glasgow, NP 11/13/16 [id number]

## 2016-11-13 NOTE — Telephone Encounter (Signed)
Patient called stating she has a sore throat. low grade fever, body aches, teeth ache Patient states she feels fatigued. When asked when symptoms started pt stated 5/17. Explained to patient it could be sinus or allergies and that the schedule was full for today and that she could go to an urgent care or ER it she was hurting that bad

## 2016-11-13 NOTE — ED Triage Notes (Signed)
Pt said she has been sick for 2 days. Fever, Freezing, body aches. Feels like she has the flu. Also having sore throat. Taking tylenol cold and sinus and vitamin C.

## 2016-11-15 ENCOUNTER — Ambulatory Visit (HOSPITAL_COMMUNITY)
Admission: EM | Admit: 2016-11-15 | Discharge: 2016-11-15 | Disposition: A | Discharge status: Home / Self Care | Payer: 59 | Attending: Internal Medicine | Admitting: Internal Medicine

## 2016-11-15 ENCOUNTER — Encounter (HOSPITAL_COMMUNITY): Payer: Self-pay | Admitting: Emergency Medicine

## 2016-11-15 DIAGNOSIS — R5383 Other fatigue: Secondary | ICD-10-CM

## 2016-11-15 DIAGNOSIS — J029 Acute pharyngitis, unspecified: Secondary | ICD-10-CM

## 2016-11-15 DIAGNOSIS — R509 Fever, unspecified: Secondary | ICD-10-CM

## 2016-11-15 LAB — POCT RAPID STREP A: STREPTOCOCCUS, GROUP A SCREEN (DIRECT): NEGATIVE

## 2016-11-15 MED ORDER — AMOXICILLIN 500 MG PO CAPS: 1000.0000 mg | ORAL_CAPSULE | Freq: Two times a day (BID) | ORAL | 0 refills | Status: AC

## 2016-11-15 MED ORDER — DEXAMETHASONE SODIUM PHOSPHATE 10 MG/ML IJ SOLN
INTRAMUSCULAR | Status: AC
  Filled 2016-11-15: qty 1

## 2016-11-15 MED ORDER — MAGIC MOUTHWASH W/LIDOCAINE: 5.0000 mL | Freq: Three times a day (TID) | ORAL | 0 refills | Status: DC | PRN

## 2016-11-15 MED ORDER — DEXAMETHASONE SODIUM PHOSPHATE 10 MG/ML IJ SOLN
10.0000 mg | Freq: Once | INTRAMUSCULAR | Status: AC
  Administered 2016-11-15: 10 mg via INTRAMUSCULAR

## 2016-11-15 NOTE — ED Triage Notes (Signed)
Patient started feeling bad on Wednesday.  Patient was [facility].  Patient reports this is the third day of tamaflu and no improvement, continues with fever and "burning" throat

## 2016-11-15 NOTE — Discharge Instructions (Signed)
Continue the Tamiflu that was prescribed at her previous visit. For your pharyngitis, based on your signs, symptoms and physical exam findings, I'm starting on amoxicillin, take 2 tablets twice a day for 10 days. For pain of starting on Magic mouthwash swish and swallow up to 3 times a day as needed. If your symptoms persist past one week return to clinic.

## 2016-11-15 NOTE — ED Provider Notes (Signed)
CSN: [id number]     Arrival date & time 11/15/16  1205 History   First MD Initiated Contact with Patient 11/15/16 1304     Chief Complaint  Patient presents with  . URI   (Consider location/radiation/quality/duration/timing/severity/associated sxs/prior Treatment) 41 year old female presents to clinic with a chief complaint of increased sore throat, swelling in the back of her throat, difficulty swallowing. I saw this patient 2 days ago, diagnosed her with an influenza-like illness, and started on Tamiflu. She states that she still has these symptoms, and is taking Tamiflu as directed, however the sore throat an difficulty swallowing is new.   The history is provided by the patient.  URI  Presenting symptoms: congestion, cough, fatigue, fever, rhinorrhea and sore throat   Associated symptoms: no sinus pain and no wheezing     Past Medical History:  Diagnosis Date  . CIN I (cervical intraepithelial neoplasia I) 07/30/2010  . History of PCOS 2008  . History of varicella   . Irregular periods/menstrual cycles 12/14/06  . LGSIL (low grade squamous intraepithelial lesion) on Pap smear 08/13/09   colpo  . Obesity   . Wears glasses   . Yeast infection    hx/o   Past Surgical History:  Procedure Laterality Date  . COLPOSCOPY  2010   Family History  Problem Relation Age of Onset  . Asthma Mother   . Depression Mother   . Hypertension Mother   . Other Mother        back surgery  . COPD Mother   . Alcohol abuse Father   . Early death Father   . Hypertension Father   . Cirrhosis Father        died of cirrhosis  . Sickle cell trait Brother   . Hypertension Brother   . Heart disease Maternal Grandmother   . Diabetes Sister   . Lupus Sister   . Cancer Neg Hx   . Stroke Neg Hx    Social History  Substance Use Topics  . Smoking status: Never Smoker  . Smokeless tobacco: Never Used  . Alcohol use No     Comment: occasionally    OB History    No data available     Review of  Systems  Constitutional: Positive for appetite change, chills, fatigue and fever.  HENT: Positive for congestion, rhinorrhea, sore throat, trouble swallowing and voice change. Negative for sinus pain and sinus pressure.   Eyes: Negative.   Respiratory: Positive for cough. Negative for shortness of breath and wheezing.   Cardiovascular: Negative for chest pain and palpitations.  Gastrointestinal: Positive for nausea. Negative for abdominal pain, diarrhea and vomiting.  Musculoskeletal: Negative.   Skin: Negative.   Neurological: Negative.     Allergies  Codeine  Home Medications   Prior to Admission medications   Medication Sig Start Date End Date Taking? Authorizing Provider  oseltamivir (TAMIFLU) 75 MG capsule Take 1 capsule (75 mg total) by mouth every 12 (twelve) hours. 11/13/16  Yes Barnet Glasgow, NP  amoxicillin (AMOXIL) 500 MG capsule Take 2 capsules (1,000 mg total) by mouth 2 (two) times daily. 11/15/16 11/25/16  Barnet Glasgow, NP  Blood Glucose Monitoring Suppl (BLOOD GLUCOSE SYSTEM PAK) KIT Use to monitor FSBS 1x daily. Dx: E11.9 08/08/15   Alycia Rossetti, MD  clotrimazole-betamethasone (LOTRISONE) cream Apply 1 application topically 2 (two) times daily. 03/11/16   [place], Modena Nunnery, MD  Glucose Blood (BLOOD GLUCOSE TEST STRIPS) STRP Use to monitor FSBS 1x daily. Dx: E11.9 08/08/15  [place], Kawanta F, MD  Lancets MISC Use to monitor FSBS 1x daily. Dx: E11.9 08/08/15   [place], Kawanta F, MD  magic mouthwash w/lidocaine SOLN Take 5 mLs by mouth 3 (three) times daily as needed for mouth pain. 11/15/16   Kennard, Lawrence, NP   Meds Ordered and Administered this Visit   Medications  dexamethasone (DECADRON) injection 10 mg (10 mg Intramuscular Given 11/15/16 1330)    BP (!) 151/97 (BP Location: Left Arm) Comment: large cuff  Pulse (!) 107   Temp 98.8 F (37.1 C) (Oral)   Resp (!) 22   LMP 10/17/2016 (Exact Date)   SpO2 100%  No data found.   Physical Exam   Constitutional: She is oriented to person, place, and time. She appears well-developed. She has a sickly appearance.  HENT:  Head: Normocephalic.  Right Ear: Tympanic membrane normal.  Left Ear: Tympanic membrane normal.  Nose: Rhinorrhea present. Right sinus exhibits no maxillary sinus tenderness and no frontal sinus tenderness. Left sinus exhibits no maxillary sinus tenderness and no frontal sinus tenderness.  Mouth/Throat: Mucous membranes are normal. Posterior oropharyngeal edema and posterior oropharyngeal erythema present. Tonsils are 3+ on the right. Tonsils are 3+ on the left. Tonsillar exudate.  Cardiovascular: Normal rate and regular rhythm.   Pulmonary/Chest: Effort normal and breath sounds normal.  Abdominal: Soft. Bowel sounds are normal.  Neurological: She is alert and oriented to person, place, and time.  Skin: Skin is warm and dry. Capillary refill takes less than 2 seconds.  Psychiatric: She has a normal mood and affect.  Nursing note and vitals reviewed.   Urgent Care Course     Procedures (including critical care time)  Labs Review Labs Reviewed  POCT RAPID STREP A    Imaging Review No results found.      MDM   1. Pharyngitis, unspecified etiology    Strep test negative, however based on physical exam findings, starting presumptively on amoxicillin, given an injection of dexamethasone, prescription of magic mouthwash. Continue Tamiflu as prescribed, provided work note.    Kennard, Lawrence, NP 11/15/16 2241  

## 2016-11-17 LAB — CULTURE, GROUP A STREP (THRC)

## 2016-11-19 ENCOUNTER — Other Ambulatory Visit: Payer: Self-pay | Admitting: *Deleted

## 2016-11-19 MED ORDER — FLUCONAZOLE 150 MG PO TABS: 150.0000 mg | ORAL_TABLET | Freq: Once | ORAL | 0 refills | Status: AC

## 2016-11-19 NOTE — Telephone Encounter (Signed)
Received call from patient.   States that she was given ABTx from UC on 11/13/2016. States that she now has vaginal itching and white colored discharge.   Prescription sent to pharmacy for Diflucan. Advised that if S/Sx do not resolve after dosage, OV will be required.

## 2017-03-29 DIAGNOSIS — E282 Polycystic ovarian syndrome: Secondary | ICD-10-CM | POA: Diagnosis not present

## 2017-05-11 NOTE — Progress Notes (Signed)
Need orders in epic.  Surgery on 05/31/17.  

## 2017-05-17 ENCOUNTER — Encounter: Payer: Self-pay | Admitting: Skilled Nursing Facility1

## 2017-05-17 ENCOUNTER — Encounter: Payer: 59 | Attending: General Surgery | Admitting: Skilled Nursing Facility1

## 2017-05-17 DIAGNOSIS — E669 Obesity, unspecified: Secondary | ICD-10-CM | POA: Diagnosis present

## 2017-05-17 DIAGNOSIS — Z713 Dietary counseling and surveillance: Secondary | ICD-10-CM | POA: Insufficient documentation

## 2017-05-17 DIAGNOSIS — E282 Polycystic ovarian syndrome: Secondary | ICD-10-CM | POA: Diagnosis not present

## 2017-05-17 NOTE — Progress Notes (Signed)
Pre-Operative Nutrition Class:  Appt start time: 3729   End time:  1830.  Patient was seen on 05/17/2017 for Pre-Operative Bariatric Surgery Education at [facility].   Surgery date: 05/31/2017 Surgery type: sleeve [facility] weight at [facility]: 252 Weight today: 261  Samples given per MNT protocol. Patient educated on appropriate usage: Bariatric Advantage Multivitamin Lot # [id number] Exp: 7/19  Bariatric Advantage Calcium  Lot # [id number] Exp: sep-01-2018  [employer] Protein Shake Lot # [id number]  The following the learning objectives were met by the patient during this course:  Identify Pre-Op Dietary Goals and will begin 2 weeks pre-operatively  Identify appropriate sources of fluids and proteins   State protein recommendations and appropriate sources pre and post-operatively  Identify Post-Operative Dietary Goals and will follow for 2 weeks post-operatively  Identify appropriate multivitamin and calcium sources  Describe the need for physical activity post-operatively and will follow MD recommendations  State when to call healthcare provider regarding medication questions or post-operative complications  Handouts given during class include:  Pre-Op Bariatric Surgery Diet Handout  Protein Shake Handout  Post-Op Bariatric Surgery Nutrition Handout  BELT Program Information Flyer  Support Group Information Flyer  [facility] Bariatric Supplements Price List  [facility]: [facility] per MD.

## 2017-05-22 IMAGING — RF DG UGI W/ KUB
13 of 19 series · 13 of 20 positions shown · non-contrast
Comparison: None.

CLINICAL DATA: Morbid obesity.  Preprocedure

EXAM:
UPPER GI SERIES WITH KUB
TECHNIQUE: After obtaining a scout radiograph a routine upper GI series was
performed using thin and high density barium.
FLUOROSCOPY TIME:  Radiation Exposure Index (as provided by the
fluoroscopic device): Not available on this device
If the device does not provide the exposure index:
Fluoroscopy Time (in minutes and seconds):  1 minutes 54 seconds
Number of Acquired Images:  5, the rest saved fluoroscopy

[Series 1: run · 1 of 1 slices shown (1 of 13)]
[im 1/1]
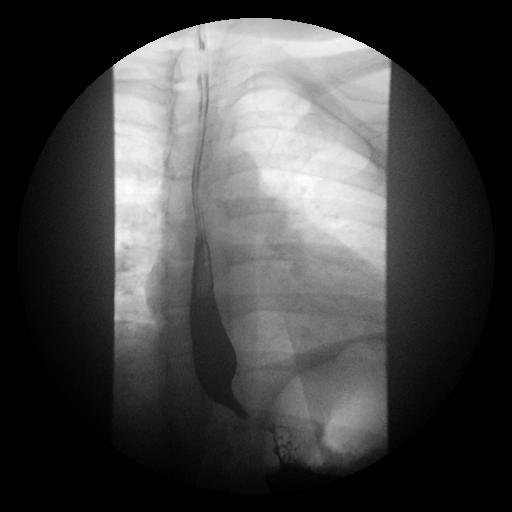

[Series 3: run · 1 of 1 slices shown (2 of 13)]
[im 1/1]
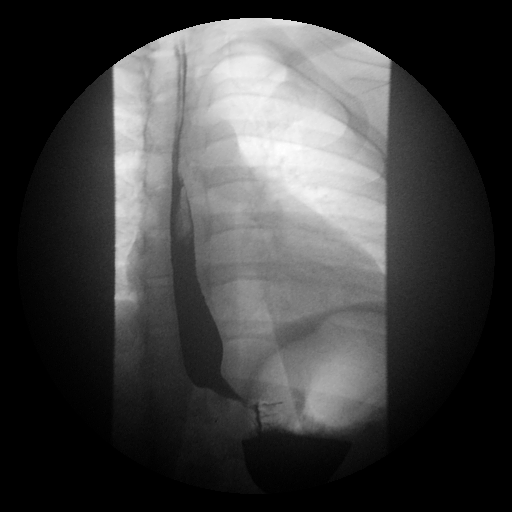

[Series 4: run · 1 of 1 slices shown (3 of 13)]
[im 1/1]
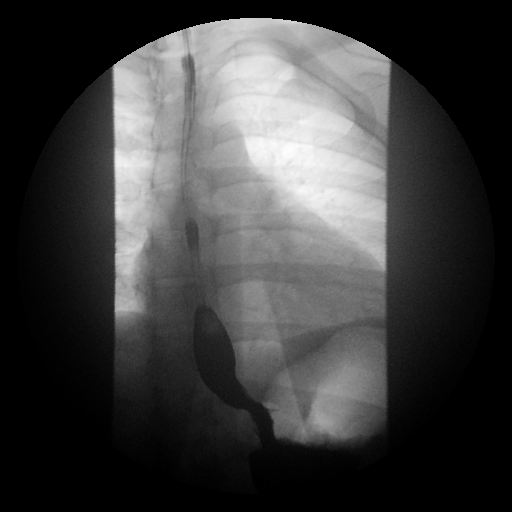

[Series 5: run · 1 of 2 slices shown (4 of 13)]
[im 2/2]
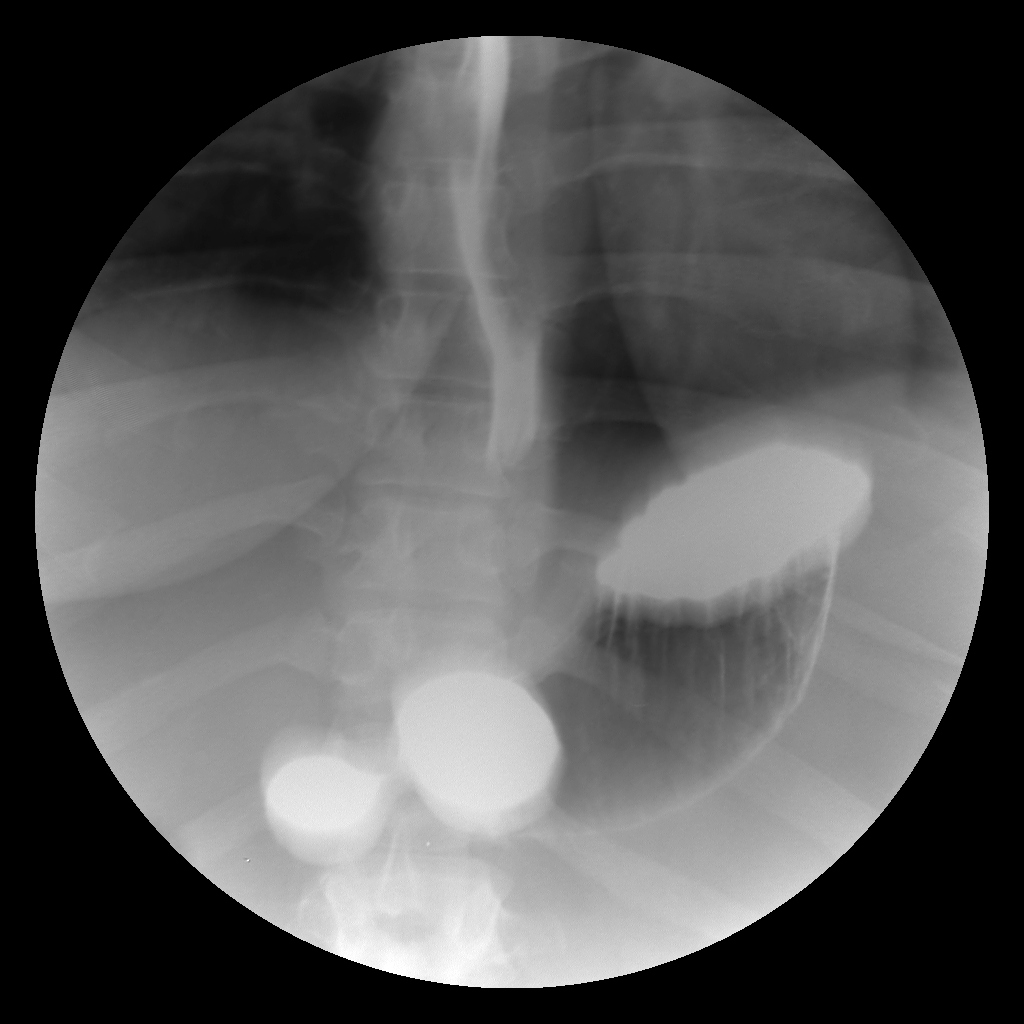

[Series 6: run · 1 of 1 slices shown (5 of 13)]
[im 1/1]
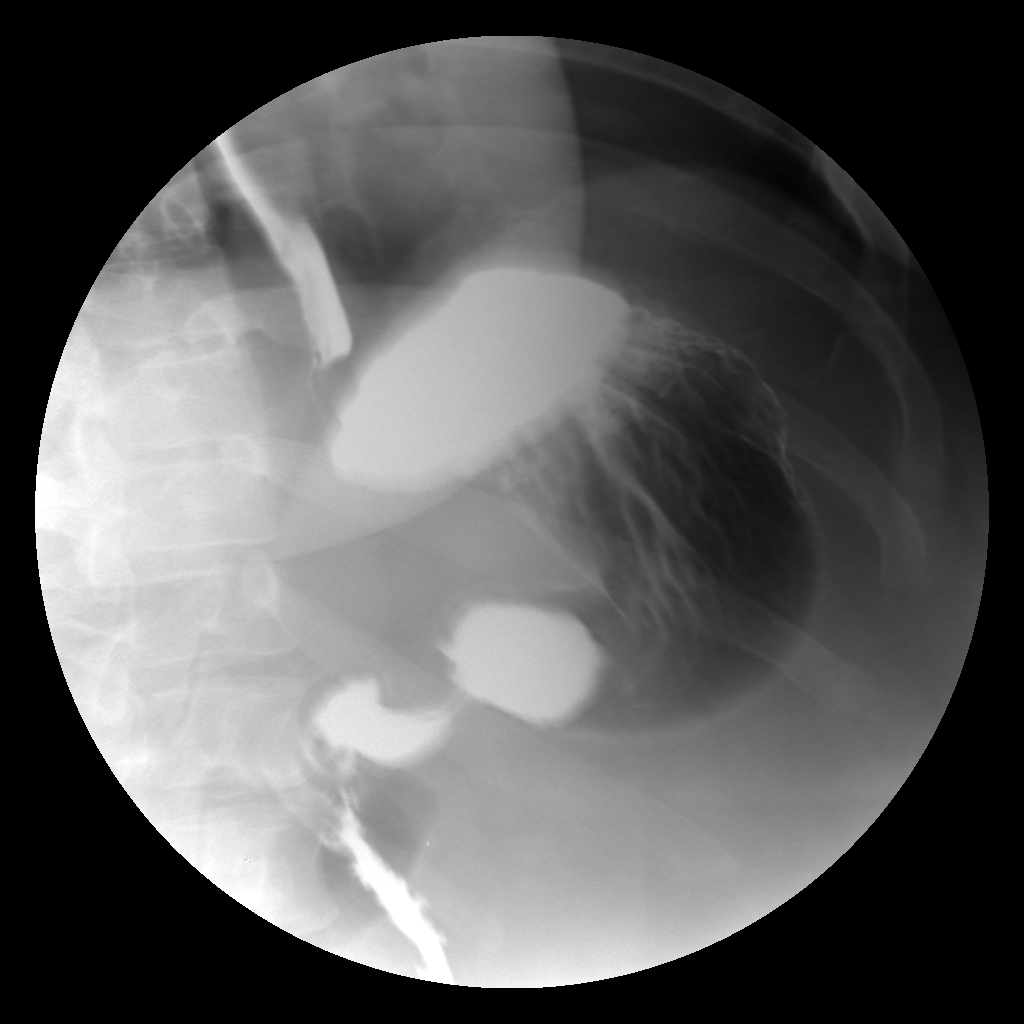

[Series 8: run · 1 of 1 slices shown (6 of 13)]
[im 1/1]
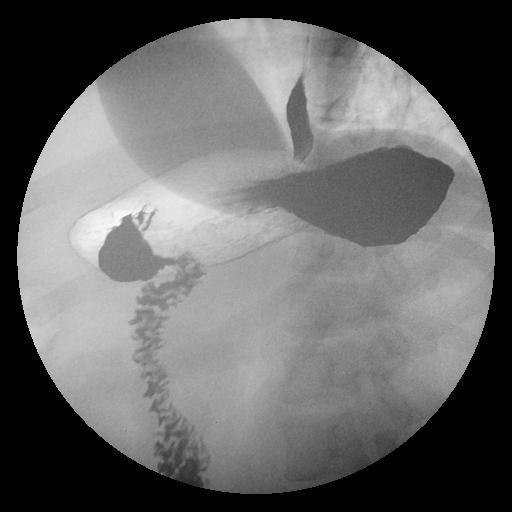

[Series 10: run · 1 of 1 slices shown (7 of 13)]
[im 1/1]
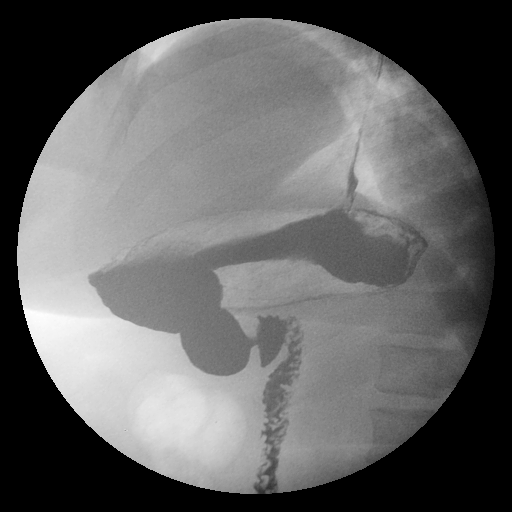

[Series 11: run · 1 of 1 slices shown (8 of 13)]
[im 1/1]
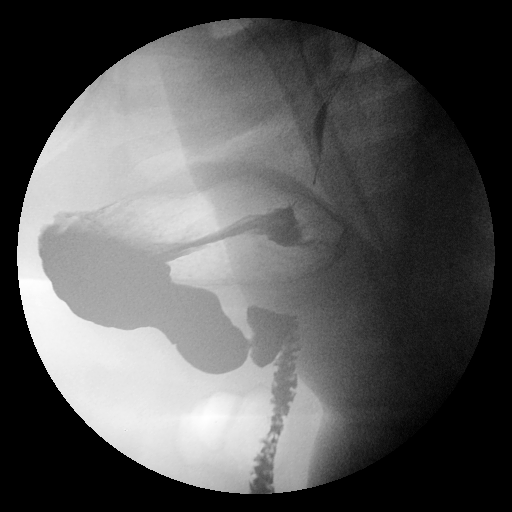

[Series 13: run · 1 of 1 slices shown (9 of 13)]
[im 1/1]
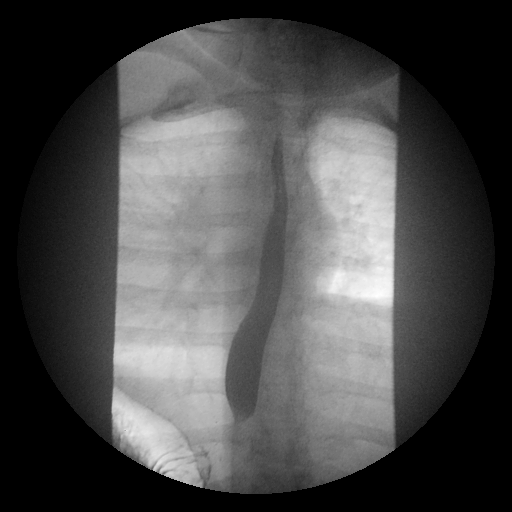

[Series 14: run · 1 of 1 slices shown (10 of 13)]
[im 1/1]
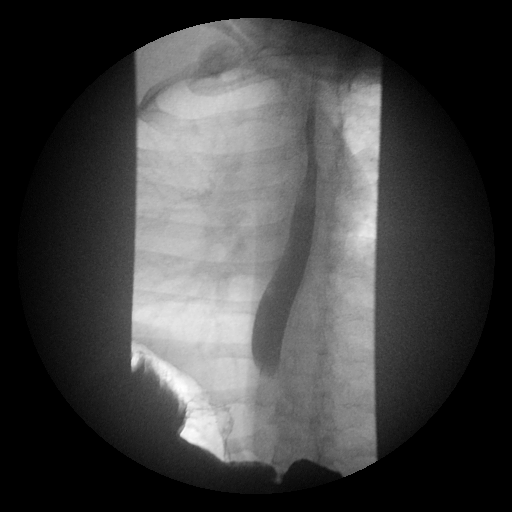

[Series 16: run · 1 of 1 slices shown (11 of 13)]
[im 1/1]
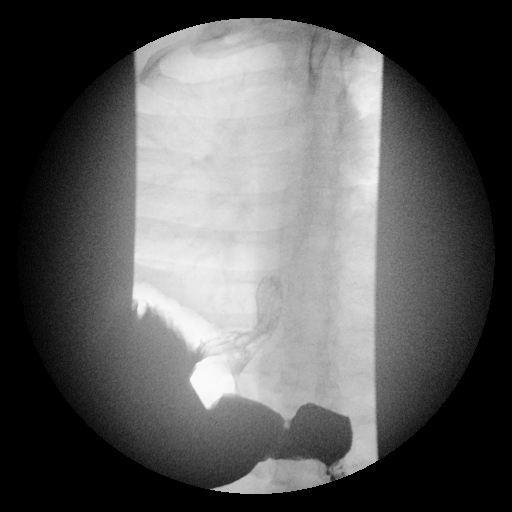

[Series 17: run · 1 of 1 slices shown (12 of 13)]
[im 1/1]
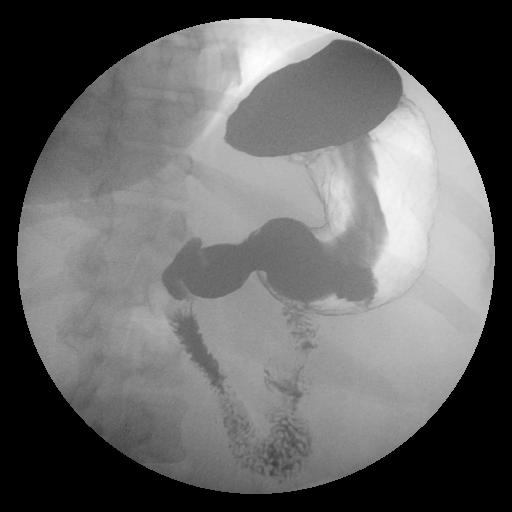

[Series 19: run · 1 of 1 slices shown (13 of 13)]
[im 1/1]
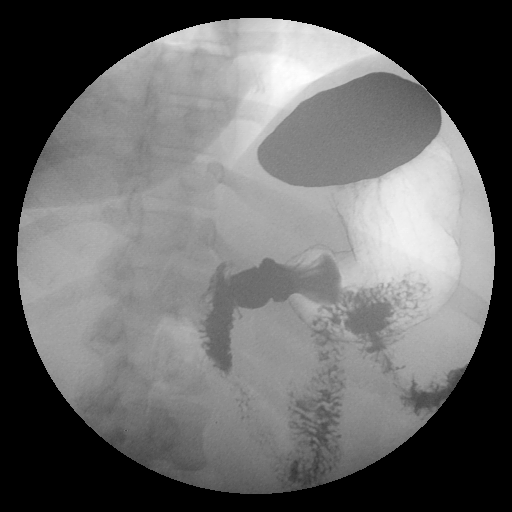

[13 of 20 positions shown; findings below may reference images not displayed]

FINDINGS: Normal bowel gas pattern. No evidence of stone. No concerning
intra-abdominal mass effect.

The esophagus has normal distensibility, motility, and course. No
mucosal lesion was seen. No evidence of pharyngeal dysfunction.

Motion blurring affected some of the gastric images. The stomach had
normal distensibility. No mucosal lesion seen including ulcer or
mass. Normal C-loop and duodenal bulb appearance. No hiatal
herniation of the stomach at rest.
IMPRESSION: Negative upper GI.

## 2017-05-25 NOTE — Progress Notes (Signed)
Need orders in epic .  Preop on 11/30.

## 2017-05-27 ENCOUNTER — Other Ambulatory Visit: Payer: Self-pay | Admitting: *Deleted

## 2017-05-27 ENCOUNTER — Encounter (HOSPITAL_COMMUNITY): Payer: Self-pay

## 2017-05-27 NOTE — Patient Instructions (Signed)
Abigail JubileeSonya A Duran  05/27/2017   Your procedure is scheduled on: 05/27/17  Report to [facility] Main  Entrance Take [facility]  elevators to 3rd floor to  Short Stay Center at    0530 AM.    Call this number if you have problems the morning of surgery [phone]    Remember: ONLY 1 PERSON MAY GO WITH YOU TO SHORT STAY TO GET  READY MORNING OF YOUR SURGERY.  Do not eat food or drink liquids :After Midnight.     Take these medicines the morning of surgery with A SIP OF WATER: NONE DO NOT TAKE ANY DIABETIC MEDICATIONS DAY OF YOUR SURGERY                               You may not have any metal on your body including hair pins and              piercings  Do not wear jewelry, make-up, lotions, powders or perfumes, deodorant             Do not wear nail polish.  Do not shave  48 hours prior to surgery.              Do not bring valuables to the hospital. [place] IS NOT             RESPONSIBLE   FOR VALUABLES.  Contacts, dentures or bridgework may not be worn into surgery.  Leave suitcase in the car. After surgery it may be brought to your room.                 Please read over the following fact sheets you were given: _____________________________________________________________________           [facility] - Preparing [facility] Before surgery, you can play an important role.  Because skin is not sterile, your skin needs to be as free of germs as possible.  You can reduce the number of germs on your skin by washing with CHG (chlorahexidine gluconate) soap before surgery.  CHG is an antiseptic cleaner which kills germs and bonds with the skin to continue killing germs even after washing. Please DO NOT use if you have an allergy to CHG or antibacterial soaps.  If your skin becomes reddened/irritated stop using the CHG and inform your nurse when you arrive at Short Stay. Do not shave (including legs and underarms) for at least 48 hours prior to the first CHG shower.   You may shave your face/neck. Please follow these instructions carefully:  1.  Shower with CHG Soap the night before surgery and the  morning of Surgery.  2.  If you choose to wash your hair, wash your hair first as usual with your  normal  shampoo.  3.  After you shampoo, rinse your hair and body thoroughly to remove the  shampoo.                           4.  Use CHG as you would any other liquid soap.  You can apply chg directly  to the skin and wash                       Gently with a scrungie or clean washcloth.  5.  Apply the CHG Soap to your body ONLY FROM THE NECK DOWN.   Do not use on face/ open                           Wound or open sores. Avoid contact with eyes, ears mouth and genitals (private parts).                       Wash face,  Genitals (private parts) with your normal soap.             6.  Wash thoroughly, paying special attention to the area where your surgery  will be performed.  7.  Thoroughly rinse your body with warm water from the neck down.  8.  DO NOT shower/wash with your normal soap after using and rinsing off  the CHG Soap.                9.  Pat yourself dry with a clean towel.            10.  Wear clean pajamas.            11.  Place clean sheets on your bed the night of your first shower and do not  sleep with pets. Day of Surgery : Do not apply any lotions/deodorants the morning of surgery.  Please wear clean clothes to the hospital/surgery center.  FAILURE TO FOLLOW THESE INSTRUCTIONS MAY RESULT IN THE CANCELLATION OF YOUR SURGERY PATIENT SIGNATURE_________________________________  NURSE SIGNATURE__________________________________  ________________________________________________________________________

## 2017-05-27 NOTE — Patient Outreach (Signed)
[facility]) Care Management  05/27/2017  Abigail JubileeSonya A Duran February 24, 1976 [id number]   Subjective: Telephone call to patient's home / mobile number, spoke with patient, and HIPAA verified.  [facility] Transition of care follow up, preoperative call follow up, patient voiced understanding, and is in agreement to both types of follow up.   Patient states she is currently at work, has preoperative hospital follow up in the morning, and requested call back on 05/28/17.   Patient verified she is having surgery on 05/31/[facility] and her primary MD is Dr. Jeanice Limurham.     Objective: Per KPN (Knowledge Performance Now, point of care tool) and chart review, patient [facility] 05/[facility]/17 for LAPAROSCOPIC GASTRIC SLEEVE RESECTION, UPPER ENDO.    Assessment: Received [facility]MR Transition of care referral on 05/24/17.  Preoperative call follow up pending patient contact.      Plan: RNCM will call patient for 2nd telephone outreach attempt, preoperative call follow up, within 10 business days if no return call.    Broghan Pannone H. Gardiner Barefootooper RN, BSN, CCM [facility] Care Management [facility] Telephonic CM Phone: [phone] Fax: [phone]

## 2017-05-27 NOTE — Progress Notes (Signed)
Please place orders in epic preop is 11-30! Thank you very much!

## 2017-05-28 ENCOUNTER — Encounter (HOSPITAL_COMMUNITY)
Admission: RE | Admit: 2017-05-28 | Discharge: 2017-05-28 | Disposition: A | Discharge status: Home / Self Care | Payer: 59 | Source: Ambulatory Visit | Attending: General Surgery | Admitting: General Surgery

## 2017-05-28 ENCOUNTER — Encounter (HOSPITAL_COMMUNITY): Payer: Self-pay

## 2017-05-28 ENCOUNTER — Ambulatory Visit: Payer: Self-pay | Admitting: *Deleted

## 2017-05-28 ENCOUNTER — Other Ambulatory Visit: Payer: Self-pay | Admitting: *Deleted

## 2017-05-28 ENCOUNTER — Other Ambulatory Visit: Payer: Self-pay

## 2017-05-28 DIAGNOSIS — Z79899 Other long term (current) drug therapy: Secondary | ICD-10-CM | POA: Diagnosis not present

## 2017-05-28 DIAGNOSIS — Z833 Family history of diabetes mellitus: Secondary | ICD-10-CM | POA: Diagnosis not present

## 2017-05-28 DIAGNOSIS — N926 Irregular menstruation, unspecified: Secondary | ICD-10-CM | POA: Diagnosis not present

## 2017-05-28 DIAGNOSIS — Z885 Allergy status to narcotic agent status: Secondary | ICD-10-CM | POA: Diagnosis not present

## 2017-05-28 DIAGNOSIS — I10 Essential (primary) hypertension: Secondary | ICD-10-CM | POA: Diagnosis not present

## 2017-05-28 DIAGNOSIS — E282 Polycystic ovarian syndrome: Secondary | ICD-10-CM | POA: Diagnosis not present

## 2017-05-28 DIAGNOSIS — Z87410 Personal history of cervical dysplasia: Secondary | ICD-10-CM | POA: Diagnosis not present

## 2017-05-28 DIAGNOSIS — R7303 Prediabetes: Secondary | ICD-10-CM | POA: Diagnosis not present

## 2017-05-28 HISTORY — DX: Headache, unspecified: R51.9

## 2017-05-28 HISTORY — DX: Headache: R51

## 2017-05-28 HISTORY — DX: Prediabetes: R73.03

## 2017-05-28 LAB — CBC
HEMATOCRIT: 37.3 % (ref 36.0–46.0)
HEMOGLOBIN: 12.3 g/dL (ref 12.0–15.0)
MCH: 27.6 pg (ref 26.0–34.0)
MCHC: 33 g/dL (ref 30.0–36.0)
MCV: 83.6 fL (ref 78.0–100.0)
Platelets: 238 10*3/uL (ref 150–400)
RBC: 4.46 MIL/uL (ref 3.87–5.11)
RDW: 14.5 % (ref 11.5–15.5)
WBC: 4.8 10*3/uL (ref 4.0–10.5)

## 2017-05-28 LAB — HCG, SERUM, QUALITATIVE: PREG SERUM: NEGATIVE

## 2017-05-28 LAB — BASIC METABOLIC PANEL
ANION GAP: 7 (ref 5–15)
BUN: 12 mg/dL (ref 6–20)
CHLORIDE: 105 mmol/L (ref 101–111)
CO2: 27 mmol/L (ref 22–32)
Calcium: 9 mg/dL (ref 8.9–10.3)
Creatinine, Ser: 0.78 mg/dL (ref 0.44–1.00)
GFR calc Af Amer: >60 mL/min (ref >60)
GFR calc non Af Amer: >60 mL/min (ref >60)
Glucose, Bld: 116 mg/dL — ABNORMAL HIGH (ref 65–99)
POTASSIUM: 4.4 mmol/L (ref 3.5–5.1)
Sodium: 139 mmol/L (ref 135–145)

## 2017-05-28 LAB — HEMOGLOBIN A1C
Hgb A1c MFr Bld: 6.2 % — ABNORMAL HIGH (ref 4.8–5.6)
Mean Plasma Glucose: 131.24 mg/dL

## 2017-05-28 NOTE — Patient Outreach (Signed)
[facility]) Care Management  05/28/2017  Abigail Duran [birth year] [id number]   Subjective: Telephone call to patient's home / mobile number, spoke with patient, and HIPAA verified.  [facility] Transition of care follow up, preoperative call follow up, patient voiced understanding, and is in agreement to both types of follow up.    Patient states she is doing well, ready [facility], estimated length of stay 1 -2 days, will be staying with mother for approximately 1 week post hospital discharge.  States mother will assist with activities of daily living / home management as needed.  Patient voices understanding of medical diagnosis, pending surgery,  and treatment plan.  States she is accessing the following [facility] benefits: outpatient pharmacy, hospital indemnity, and has family medical leave act (FMLA) in place.  Per patient's request ,email sent to patient with contact information for the following resources:  [place] Patient Accounting for itemized bill request  [phone]) and [employer] for Advanced Directives document completion [phone]).  Patient states she does not have any preoperative questions, care coordination, disease management, disease monitoring, transportation, community resource, or pharmacy needs at this time. States she is very appreciative of the follow up and is in agreement to receive [facility] Care Management information post transition of care follow up.    Objective: Per KPN (Knowledge Performance Now, point of care tool) and chart review, [facility]17 for LAPAROSCOPIC GASTRIC SLEEVE RESECTION, UPPER ENDO.    Assessment: Received UMR Transition of care referral on 05/24/17.  Preoperative call completed, and transition of care follow up pending notification of patient discharge.     Plan: RNCM will call patient for  telephone outreach  attempt, transition of care follow up, within 3 business days of hospital discharge notification.    Cyrilla Durkin H. Gardiner Barefootooper RN, BSN, CCM [facility] Care Management [facility] Telephonic CM Phone: [phone] Fax: [phone]

## 2017-05-30 ENCOUNTER — Encounter (HOSPITAL_COMMUNITY): Payer: Self-pay | Admitting: Anesthesiology

## 2017-05-30 NOTE — Anesthesia Preprocedure Evaluation (Addendum)
Anesthesia Evaluation  Patient identified by MRN, date of birth, ID band Patient awake    Reviewed: Allergy & Precautions, NPO status , Patient's Chart, lab work & pertinent test results  Airway Mallampati: II       Dental no notable dental hx. (+) Teeth Intact   Pulmonary neg pulmonary ROS,    Pulmonary exam normal breath sounds clear to auscultation       Cardiovascular hypertension, Normal cardiovascular exam Rhythm:Regular Rate:Normal     Neuro/Psych negative neurological ROS  negative psych ROS   GI/Hepatic negative GI ROS, Neg liver ROS,   Endo/Other  Morbid obesity  Renal/GU negative Renal ROS     Musculoskeletal negative musculoskeletal ROS (+)   Abdominal (+) + obese,   Peds  Hematology   Anesthesia Other Findings   Reproductive/Obstetrics negative OB ROS                            Anesthesia Physical Anesthesia Plan  ASA: III  Anesthesia Plan: General   Post-op Pain Management:    Induction: Intravenous  PONV Risk Score and Plan: 4 or greater and Ondansetron, Dexamethasone, Midazolam and Scopolamine patch - Pre-op  Airway Management Planned: Oral ETT  Additional Equipment:   Intra-op Plan:   Post-operative Plan: Extubation in OR  Informed Consent: I have reviewed the patients History and Physical, chart, labs and discussed the procedure including the risks, benefits and alternatives for the proposed anesthesia with the patient or authorized representative who has indicated his/her understanding and acceptance.   Dental advisory given  Plan Discussed with: CRNA and Surgeon  Anesthesia Plan Comments:        Anesthesia Quick Evaluation

## 2017-05-31 ENCOUNTER — Encounter (HOSPITAL_COMMUNITY): Payer: Self-pay | Admitting: Emergency Medicine

## 2017-05-31 ENCOUNTER — Other Ambulatory Visit: Payer: Self-pay

## 2017-05-31 ENCOUNTER — Inpatient Hospital Stay (HOSPITAL_COMMUNITY): Payer: 59 | Admitting: Anesthesiology

## 2017-05-31 ENCOUNTER — Inpatient Hospital Stay (HOSPITAL_COMMUNITY)
Admission: RE | Admit: 2017-05-31 | Discharge: 2017-06-01 | DRG: 621 | Disposition: A | Discharge status: Home / Self Care | Payer: 59 | Source: Ambulatory Visit | Attending: General Surgery | Admitting: General Surgery

## 2017-05-31 ENCOUNTER — Encounter (HOSPITAL_COMMUNITY)
Admission: RE | Disposition: A | Discharge status: Home / Self Care | Payer: Self-pay | Source: Ambulatory Visit | Attending: General Surgery

## 2017-05-31 DIAGNOSIS — Z87410 Personal history of cervical dysplasia: Secondary | ICD-10-CM | POA: Diagnosis not present

## 2017-05-31 DIAGNOSIS — N926 Irregular menstruation, unspecified: Secondary | ICD-10-CM | POA: Diagnosis present

## 2017-05-31 DIAGNOSIS — Z79899 Other long term (current) drug therapy: Secondary | ICD-10-CM | POA: Diagnosis not present

## 2017-05-31 DIAGNOSIS — K2950 Unspecified chronic gastritis without bleeding: Secondary | ICD-10-CM | POA: Diagnosis not present

## 2017-05-31 DIAGNOSIS — Z6841 Body Mass Index (BMI) 40.0 and over, adult: Secondary | ICD-10-CM

## 2017-05-31 DIAGNOSIS — Z885 Allergy status to narcotic agent status: Secondary | ICD-10-CM | POA: Diagnosis not present

## 2017-05-31 DIAGNOSIS — I10 Essential (primary) hypertension: Secondary | ICD-10-CM | POA: Diagnosis not present

## 2017-05-31 DIAGNOSIS — R7303 Prediabetes: Secondary | ICD-10-CM | POA: Diagnosis present

## 2017-05-31 DIAGNOSIS — N870 Mild cervical dysplasia: Secondary | ICD-10-CM | POA: Diagnosis not present

## 2017-05-31 DIAGNOSIS — Z833 Family history of diabetes mellitus: Secondary | ICD-10-CM

## 2017-05-31 DIAGNOSIS — E282 Polycystic ovarian syndrome: Secondary | ICD-10-CM | POA: Diagnosis not present

## 2017-05-31 DIAGNOSIS — E669 Obesity, unspecified: Secondary | ICD-10-CM | POA: Diagnosis present

## 2017-05-31 HISTORY — PX: LAPAROSCOPIC GASTRIC SLEEVE RESECTION: SHX5895

## 2017-05-31 LAB — CREATININE, SERUM: Creatinine, Ser: 0.76 mg/dL (ref 0.44–1.00)

## 2017-05-31 LAB — TYPE AND SCREEN
ABO/RH(D): B POS
ANTIBODY SCREEN: NEGATIVE

## 2017-05-31 LAB — CBC
HEMATOCRIT: 36.7 % (ref 36.0–46.0)
HEMOGLOBIN: 12.2 g/dL (ref 12.0–15.0)
MCH: 27.7 pg (ref 26.0–34.0)
MCHC: 33.2 g/dL (ref 30.0–36.0)
MCV: 83.2 fL (ref 78.0–100.0)
Platelets: 217 10*3/uL (ref 150–400)
RBC: 4.41 MIL/uL (ref 3.87–5.11)
RDW: 14.6 % (ref 11.5–15.5)
WBC: 10.6 10*3/uL — ABNORMAL HIGH (ref 4.0–10.5)

## 2017-05-31 LAB — GLUCOSE, CAPILLARY: Glucose-Capillary: 184 mg/dL — ABNORMAL HIGH (ref 65–99)

## 2017-05-31 LAB — ABO/RH: ABO/RH(D): B POS

## 2017-05-31 LAB — PREGNANCY, URINE: PREG TEST UR: NEGATIVE

## 2017-05-31 SURGERY — GASTRECTOMY, SLEEVE, LAPAROSCOPIC
Anesthesia: General | Site: Abdomen

## 2017-05-31 MED ORDER — PROMETHAZINE HCL 25 MG/ML IJ SOLN: 6.2500 mg | INTRAMUSCULAR | Status: DC | PRN

## 2017-05-31 MED ORDER — CHLORHEXIDINE GLUCONATE 4 % EX LIQD: 60.0000 mL | Freq: Once | CUTANEOUS | Status: DC

## 2017-05-31 MED ORDER — ESMOLOL HCL 100 MG/10ML IV SOLN
INTRAVENOUS | Status: DC | PRN
  Administered 2017-05-31: 30 mg via INTRAVENOUS

## 2017-05-31 MED ORDER — MORPHINE SULFATE (PF) 2 MG/ML IV SOLN
1.0000 mg | INTRAVENOUS | Status: DC | PRN
  Administered 2017-05-31 (×2): 2 mg via INTRAVENOUS
  Filled 2017-05-31 (×2): qty 1

## 2017-05-31 MED ORDER — ENOXAPARIN SODIUM 30 MG/0.3ML ~~LOC~~ SOLN
30.0000 mg | Freq: Two times a day (BID) | SUBCUTANEOUS | Status: DC
  Administered 2017-05-31 – 2017-06-01 (×2): 30 mg via SUBCUTANEOUS
  Filled 2017-05-31 (×2): qty 0.3

## 2017-05-31 MED ORDER — ONDANSETRON HCL 4 MG/2ML IJ SOLN
4.0000 mg | INTRAMUSCULAR | Status: DC | PRN
  Administered 2017-05-31 (×3): 4 mg via INTRAVENOUS
  Filled 2017-05-31 (×3): qty 2

## 2017-05-31 MED ORDER — MEPERIDINE HCL 50 MG/ML IJ SOLN: 6.2500 mg | INTRAMUSCULAR | Status: DC | PRN

## 2017-05-31 MED ORDER — ACETAMINOPHEN 160 MG/5ML PO SOLN
650.0000 mg | ORAL | Status: DC | PRN
  Administered 2017-06-01: 650 mg via ORAL
  Filled 2017-05-31: qty 20.3

## 2017-05-31 MED ORDER — CEFOTETAN DISODIUM-DEXTROSE 2-2.08 GM-%(50ML) IV SOLR
2.0000 g | INTRAVENOUS | Status: AC
  Administered 2017-05-31: 2 g via INTRAVENOUS

## 2017-05-31 MED ORDER — HYDRALAZINE HCL 20 MG/ML IJ SOLN: 10.0000 mg | INTRAMUSCULAR | Status: DC | PRN

## 2017-05-31 MED ORDER — KETOROLAC TROMETHAMINE 30 MG/ML IJ SOLN: 30.0000 mg | Freq: Once | INTRAMUSCULAR | Status: DC | PRN

## 2017-05-31 MED ORDER — DEXAMETHASONE SODIUM PHOSPHATE 10 MG/ML IJ SOLN
INTRAMUSCULAR | Status: AC
  Filled 2017-05-31: qty 1

## 2017-05-31 MED ORDER — OXYCODONE HCL 5 MG/5ML PO SOLN
5.0000 mg | ORAL | Status: DC | PRN
  Administered 2017-05-31 – 2017-06-01 (×2): 5 mg via ORAL
  Filled 2017-05-31 (×2): qty 5

## 2017-05-31 MED ORDER — HEPARIN SODIUM (PORCINE) 5000 UNIT/ML IJ SOLN
5000.0000 [IU] | INTRAMUSCULAR | Status: AC
  Administered 2017-05-31: 5000 [IU] via SUBCUTANEOUS
  Filled 2017-05-31: qty 1

## 2017-05-31 MED ORDER — HYDROMORPHONE HCL 1 MG/ML IJ SOLN
INTRAMUSCULAR | Status: AC
  Filled 2017-05-31: qty 1

## 2017-05-31 MED ORDER — SODIUM CHLORIDE 0.9 % IV SOLN
INTRAVENOUS | Status: DC
  Administered 2017-05-31 – 2017-06-01 (×3): via INTRAVENOUS

## 2017-05-31 MED ORDER — 0.9 % SODIUM CHLORIDE (POUR BTL) OPTIME
TOPICAL | Status: DC | PRN
  Administered 2017-05-31: 1000 mL

## 2017-05-31 MED ORDER — BUPIVACAINE-EPINEPHRINE (PF) 0.25% -1:200000 IJ SOLN
INTRAMUSCULAR | Status: AC
  Filled 2017-05-31: qty 30

## 2017-05-31 MED ORDER — PHENYLEPHRINE 40 MCG/ML (10ML) SYRINGE FOR IV PUSH (FOR BLOOD PRESSURE SUPPORT)
PREFILLED_SYRINGE | INTRAVENOUS | Status: DC | PRN
  Administered 2017-05-31: 120 ug via INTRAVENOUS
  Administered 2017-05-31: 80 ug via INTRAVENOUS

## 2017-05-31 MED ORDER — KETAMINE HCL 10 MG/ML IJ SOLN
INTRAMUSCULAR | Status: AC
  Filled 2017-05-31: qty 1

## 2017-05-31 MED ORDER — LIDOCAINE 2% (20 MG/ML) 5 ML SYRINGE
INTRAMUSCULAR | Status: DC | PRN
  Administered 2017-05-31: 1.5 mg/kg/h via INTRAVENOUS

## 2017-05-31 MED ORDER — KETAMINE HCL 10 MG/ML IJ SOLN
INTRAMUSCULAR | Status: DC | PRN
  Administered 2017-05-31: 25 mg via INTRAVENOUS

## 2017-05-31 MED ORDER — BUPIVACAINE-EPINEPHRINE 0.25% -1:200000 IJ SOLN
INTRAMUSCULAR | Status: DC | PRN
  Administered 2017-05-31: 30 mL

## 2017-05-31 MED ORDER — PROPOFOL 10 MG/ML IV BOLUS
INTRAVENOUS | Status: DC | PRN
  Administered 2017-05-31: 150 mg via INTRAVENOUS
  Administered 2017-05-31: 50 mg via INTRAVENOUS

## 2017-05-31 MED ORDER — FENTANYL CITRATE (PF) 100 MCG/2ML IJ SOLN
INTRAMUSCULAR | Status: DC | PRN
  Administered 2017-05-31 (×4): 50 ug via INTRAVENOUS

## 2017-05-31 MED ORDER — PANTOPRAZOLE SODIUM 40 MG IV SOLR
40.0000 mg | Freq: Every day | INTRAVENOUS | Status: DC
  Administered 2017-05-31: 40 mg via INTRAVENOUS
  Filled 2017-05-31: qty 40

## 2017-05-31 MED ORDER — APREPITANT 40 MG PO CAPS
40.0000 mg | ORAL_CAPSULE | ORAL | Status: AC
  Administered 2017-05-31: 40 mg via ORAL
  Filled 2017-05-31: qty 1

## 2017-05-31 MED ORDER — GABAPENTIN 300 MG PO CAPS
300.0000 mg | ORAL_CAPSULE | ORAL | Status: AC
  Administered 2017-05-31: 300 mg via ORAL
  Filled 2017-05-31: qty 1

## 2017-05-31 MED ORDER — LACTATED RINGERS IV SOLN
INTRAVENOUS | Status: DC | PRN
  Administered 2017-05-31 (×2): via INTRAVENOUS

## 2017-05-31 MED ORDER — PREMIER PROTEIN SHAKE: 2.0000 [oz_av] | ORAL | Status: DC

## 2017-05-31 MED ORDER — HYDROMORPHONE HCL 1 MG/ML IJ SOLN
0.2500 mg | INTRAMUSCULAR | Status: DC | PRN
Start: 2017-05-31 — End: 2017-05-31
  Administered 2017-05-31 (×2): 0.5 mg via INTRAVENOUS

## 2017-05-31 MED ORDER — DEXAMETHASONE SODIUM PHOSPHATE 4 MG/ML IJ SOLN
4.0000 mg | INTRAMUSCULAR | Status: AC
  Administered 2017-05-31: 10 mg via INTRAVENOUS

## 2017-05-31 MED ORDER — ROCURONIUM BROMIDE 50 MG/5ML IV SOSY
PREFILLED_SYRINGE | INTRAVENOUS | Status: DC | PRN
  Administered 2017-05-31: 50 mg via INTRAVENOUS
  Administered 2017-05-31: 20 mg via INTRAVENOUS

## 2017-05-31 MED ORDER — SUGAMMADEX SODIUM 500 MG/5ML IV SOLN
INTRAVENOUS | Status: DC | PRN
  Administered 2017-05-31: 100 mg via INTRAVENOUS
  Administered 2017-05-31: 250 mg via INTRAVENOUS

## 2017-05-31 MED ORDER — MIDAZOLAM HCL 2 MG/2ML IJ SOLN
INTRAMUSCULAR | Status: AC
  Filled 2017-05-31: qty 2

## 2017-05-31 MED ORDER — ROCURONIUM BROMIDE 50 MG/5ML IV SOSY
PREFILLED_SYRINGE | INTRAVENOUS | Status: AC
  Filled 2017-05-31: qty 10

## 2017-05-31 MED ORDER — SIMETHICONE 80 MG PO CHEW
80.0000 mg | CHEWABLE_TABLET | Freq: Four times a day (QID) | ORAL | Status: DC | PRN
  Administered 2017-05-31: 80 mg via ORAL
  Filled 2017-05-31: qty 1

## 2017-05-31 MED ORDER — SCOPOLAMINE 1 MG/3DAYS TD PT72
1.0000 | MEDICATED_PATCH | TRANSDERMAL | Status: DC
  Administered 2017-05-31: 1.5 mg via TRANSDERMAL
  Filled 2017-05-31: qty 1

## 2017-05-31 MED ORDER — ESMOLOL HCL 100 MG/10ML IV SOLN
INTRAVENOUS | Status: AC
  Filled 2017-05-31: qty 10

## 2017-05-31 MED ORDER — MIDAZOLAM HCL 5 MG/5ML IJ SOLN
INTRAMUSCULAR | Status: DC | PRN
  Administered 2017-05-31: 2 mg via INTRAVENOUS

## 2017-05-31 MED ORDER — CEFOTETAN DISODIUM-DEXTROSE 2-2.08 GM-%(50ML) IV SOLR
INTRAVENOUS | Status: AC
  Filled 2017-05-31: qty 50

## 2017-05-31 MED ORDER — ACETAMINOPHEN 500 MG PO TABS
1000.0000 mg | ORAL_TABLET | ORAL | Status: AC
  Administered 2017-05-31: 1000 mg via ORAL
  Filled 2017-05-31: qty 2

## 2017-05-31 MED ORDER — TRAMADOL HCL 50 MG PO TABS: 50.0000 mg | ORAL_TABLET | Freq: Four times a day (QID) | ORAL | Status: DC | PRN

## 2017-05-31 MED ORDER — FENTANYL CITRATE (PF) 250 MCG/5ML IJ SOLN
INTRAMUSCULAR | Status: AC
  Filled 2017-05-31: qty 5

## 2017-05-31 MED ORDER — HYDROMORPHONE HCL 1 MG/ML IJ SOLN: 0.2500 mg | INTRAMUSCULAR | Status: DC | PRN

## 2017-05-31 MED ORDER — DIPHENHYDRAMINE HCL 50 MG/ML IJ SOLN
INTRAMUSCULAR | Status: DC | PRN
  Administered 2017-05-31: 12.5 mg via INTRAVENOUS

## 2017-05-31 MED ORDER — BUPIVACAINE LIPOSOME 1.3 % IJ SUSP
20.0000 mL | Freq: Once | INTRAMUSCULAR | Status: AC
  Administered 2017-05-31: 20 mL
  Filled 2017-05-31: qty 20

## 2017-05-31 MED ORDER — PROPOFOL 10 MG/ML IV BOLUS
INTRAVENOUS | Status: AC
  Filled 2017-05-31: qty 20

## 2017-05-31 MED ORDER — LIDOCAINE 2% (20 MG/ML) 5 ML SYRINGE
INTRAMUSCULAR | Status: AC
  Filled 2017-05-31: qty 10

## 2017-05-31 MED ORDER — LACTATED RINGERS IR SOLN
Status: DC | PRN
  Administered 2017-05-31: 1000 mL

## 2017-05-31 MED ORDER — PHENYLEPHRINE 40 MCG/ML (10ML) SYRINGE FOR IV PUSH (FOR BLOOD PRESSURE SUPPORT)
PREFILLED_SYRINGE | INTRAVENOUS | Status: AC
  Filled 2017-05-31: qty 10

## 2017-05-31 MED ORDER — LIDOCAINE 2% (20 MG/ML) 5 ML SYRINGE
INTRAMUSCULAR | Status: DC | PRN
  Administered 2017-05-31: 50 mg via INTRAVENOUS

## 2017-05-31 MED ORDER — DIPHENHYDRAMINE HCL 50 MG/ML IJ SOLN
INTRAMUSCULAR | Status: AC
  Filled 2017-05-31: qty 1

## 2017-05-31 MED ORDER — ONDANSETRON HCL 4 MG/2ML IJ SOLN
INTRAMUSCULAR | Status: AC
  Filled 2017-05-31: qty 2

## 2017-05-31 SURGICAL SUPPLY — 53 items
APPLIER CLIP 5 13 M/L LIGAMAX5 (MISCELLANEOUS)
APPLIER CLIP ROT 13.4 12 LRG (CLIP)
BAG LAPAROSCOPIC 12 15 PORT 16 (BASKET) ×1 IMPLANT
BAG RETRIEVAL 12/15 (BASKET) ×2
BANDAGE ADH SHEER 1  50/CT (GAUZE/BANDAGES/DRESSINGS) ×12 IMPLANT
BENZOIN TINCTURE PRP APPL 2/3 (GAUZE/BANDAGES/DRESSINGS) ×2 IMPLANT
BLADE SURG SZ11 CARB STEEL (BLADE) ×2 IMPLANT
CABLE HIGH FREQUENCY MONO STRZ (ELECTRODE) ×2 IMPLANT
CHLORAPREP W/TINT 26ML (MISCELLANEOUS) ×2 IMPLANT
CLIP APPLIE 5 13 M/L LIGAMAX5 (MISCELLANEOUS) IMPLANT
CLIP APPLIE ROT 13.4 12 LRG (CLIP) IMPLANT
COVER SURGICAL LIGHT HANDLE (MISCELLANEOUS) ×2 IMPLANT
DRAIN CHANNEL 19F RND (DRAIN) IMPLANT
DRAPE UNIVERSAL PACK (DRAPES) ×2 IMPLANT
ELECT REM PT RETURN 15FT ADLT (MISCELLANEOUS) ×2 IMPLANT
EVACUATOR SILICONE 100CC (DRAIN) IMPLANT
GAUZE SPONGE 4X4 12PLY STRL (GAUZE/BANDAGES/DRESSINGS) IMPLANT
GLOVE BIOGEL PI IND STRL 7.0 (GLOVE) ×1 IMPLANT
GLOVE BIOGEL PI INDICATOR 7.0 (GLOVE) ×1
GLOVE SURG SS PI 7.0 STRL IVOR (GLOVE) ×2 IMPLANT
GOWN STRL REUS W/TWL LRG LVL3 (GOWN DISPOSABLE) ×2 IMPLANT
GOWN STRL REUS W/TWL XL LVL3 (GOWN DISPOSABLE) ×6 IMPLANT
GRASPER SUT TROCAR 14GX15 (MISCELLANEOUS) ×2 IMPLANT
HANDLE STAPLE EGIA 4 XL (STAPLE) ×2 IMPLANT
HOVERMATT SINGLE USE (MISCELLANEOUS) ×2 IMPLANT
KIT BASIN OR (CUSTOM PROCEDURE TRAY) ×2 IMPLANT
MARKER SKIN DUAL TIP RULER LAB (MISCELLANEOUS) ×2 IMPLANT
NEEDLE SPNL 22GX3.5 QUINCKE BK (NEEDLE) ×2 IMPLANT
RELOAD EGIA 45 MED/THCK PURPLE (STAPLE) IMPLANT
RELOAD EGIA 60 MED/THCK PURPLE (STAPLE) ×6 IMPLANT
RELOAD EGIA BLACK ROTIC 45MM (STAPLE) IMPLANT
RELOAD TRI 2.0 60 XTHK VAS SUL (STAPLE) ×4 IMPLANT
SCISSORS LAP 5X45 EPIX DISP (ENDOMECHANICALS) IMPLANT
SET IRRIG TUBING LAPAROSCOPIC (IRRIGATION / IRRIGATOR) ×2 IMPLANT
SHEARS HARMONIC ACE PLUS 45CM (MISCELLANEOUS) ×2 IMPLANT
SLEEVE GASTRECTOMY 40FR VISIGI (MISCELLANEOUS) ×2 IMPLANT
SLEEVE XCEL OPT CAN 5 100 (ENDOMECHANICALS) ×4 IMPLANT
SOLUTION ANTI FOG 6CC (MISCELLANEOUS) ×2 IMPLANT
SPONGE LAP 18X18 X RAY DECT (DISPOSABLE) ×2 IMPLANT
STRIP CLOSURE SKIN 1/2X4 (GAUZE/BANDAGES/DRESSINGS) ×2 IMPLANT
SUT ETHIBOND 0 36 GRN (SUTURE) IMPLANT
SUT ETHILON 2 0 PS N (SUTURE) IMPLANT
SUT MNCRL AB 4-0 PS2 18 (SUTURE) ×2 IMPLANT
SUT VICRYL 0 TIES 12 18 (SUTURE) ×2 IMPLANT
SYR 20CC LL (SYRINGE) ×2 IMPLANT
SYR 50ML LL SCALE MARK (SYRINGE) ×2 IMPLANT
TOWEL OR 17X26 10 PK STRL BLUE (TOWEL DISPOSABLE) ×2 IMPLANT
TOWEL OR NON WOVEN STRL DISP B (DISPOSABLE) ×2 IMPLANT
TROCAR BLADELESS 15MM (ENDOMECHANICALS) ×2 IMPLANT
TROCAR BLADELESS OPT 5 100 (ENDOMECHANICALS) ×2 IMPLANT
TUBING CONNECTING 10 (TUBING) ×2 IMPLANT
TUBING ENDO SMARTCAP PENTAX (MISCELLANEOUS) IMPLANT
TUBING INSUF HEATED (TUBING) ×2 IMPLANT

## 2017-05-31 NOTE — H&P (Signed)
Abigail Duran is an 41 y.o. female.   Chief Complaint: obesity HPI: 41 yo female with history of PCOS and prediabetes presents for sleeve gastrectomy. She has completed all requirements and is ready to proceed.  Past Medical History:  Diagnosis Date  . CIN I (cervical intraepithelial neoplasia I) 07/30/2010  . Headache   . History of PCOS 2008  . History of varicella   . Irregular periods/menstrual cycles 12/14/06  . LGSIL (low grade squamous intraepithelial lesion) on Pap smear 08/13/09   colpo  . Obesity   . Pre-diabetes   . Wears glasses   . Yeast infection    hx/o    Past Surgical History:  Procedure Laterality Date  . COLPOSCOPY  2010    Family History  Problem Relation Age of Onset  . Asthma Mother   . Depression Mother   . Hypertension Mother   . Other Mother        back surgery  . COPD Mother   . Alcohol abuse Father   . Early death Father   . Hypertension Father   . Cirrhosis Father        died of cirrhosis  . Sickle cell trait Brother   . Hypertension Brother   . Heart disease Maternal Grandmother   . Diabetes Sister   . Lupus Sister   . Cancer Neg Hx   . Stroke Neg Hx    Social History:  reports that  has never smoked. she has never used smokeless tobacco. She reports that she does not drink alcohol or use drugs.  Allergies:  Allergies  Allergen Reactions  . Codeine Nausea And Vomiting    Medications Prior to Admission  Medication Sig Dispense Refill  . acetaminophen (TYLENOL) 500 MG tablet Take 1,000 mg by mouth every 6 (six) hours as needed for moderate pain or headache.    . Calcium Carbonate-Vitamin D (CALCIUM-VITAMIN D3 PO) Take 1 tablet by mouth 3 (three) times daily.    Marland Kitchen OVER THE COUNTER MEDICATION Take 1 tablet by mouth 3 (three) times daily. Bariatric Advantage Chewable Multi-DA Mixed Fruit Supplement    . Blood Glucose Monitoring Suppl (BLOOD GLUCOSE SYSTEM PAK) KIT Use to monitor FSBS 1x daily. Dx: E11.9 (Patient not taking: Reported on  05/24/2017) 1 each 1  . clotrimazole-betamethasone (LOTRISONE) cream Apply 1 application topically 2 (two) times daily. (Patient taking differently: Apply 1 application topically 2 (two) times daily as needed (for breakout on skin). ) 30 g 0  . Glucose Blood (BLOOD GLUCOSE TEST STRIPS) STRP Use to monitor FSBS 1x daily. Dx: E11.9 (Patient not taking: Reported on 05/24/2017) 50 each 11  . Lancets MISC Use to monitor FSBS 1x daily. Dx: E11.9 (Patient not taking: Reported on 05/24/2017) 50 each 11    Results for orders placed or performed during the hospital encounter of 05/31/17 (from the past 48 hour(s))  Pregnancy, urine STAT morning of surgery     Status: None   Collection Time: 05/31/17  6:39 AM  Result Value Ref Range   Preg Test, Ur NEGATIVE NEGATIVE    Comment:        THE SENSITIVITY OF THIS METHODOLOGY IS >20 mIU/mL.    No results found.  Review of Systems  Constitutional: Negative for chills and fever.  HENT: Negative for hearing loss.   Eyes: Negative for blurred vision and double vision.  Respiratory: Negative for cough and hemoptysis.   Cardiovascular: Negative for chest pain and palpitations.  Gastrointestinal: Negative for abdominal pain,  nausea and vomiting.  Genitourinary: Negative for dysuria and urgency.  Musculoskeletal: Negative for myalgias and neck pain.  Skin: Negative for itching and rash.  Neurological: Negative for dizziness, tingling and headaches.  Endo/Heme/Allergies: Does not bruise/bleed easily.  Psychiatric/Behavioral: Negative for depression and suicidal ideas.    Blood pressure (!) 152/90, pulse (!) 101, temperature 98.3 F (36.8 C), temperature source Oral, resp. rate 18, height '5\' 3"'$  (1.6 m), weight 115.7 kg (255 lb), last menstrual period 05/03/2017, SpO2 99 %. Physical Exam  Vitals reviewed. Constitutional: She is oriented to person, place, and time. She appears well-developed and well-nourished.  HENT:  Head: Normocephalic and atraumatic.   Eyes: Conjunctivae and EOM are normal. Pupils are equal, round, and reactive to light.  Neck: Normal range of motion. Neck supple.  Cardiovascular: Normal rate and regular rhythm.  Respiratory: Effort normal and breath sounds normal.  GI: Soft. Bowel sounds are normal. She exhibits no distension. There is no tenderness.  Musculoskeletal: Normal range of motion.  Neurological: She is alert and oriented to person, place, and time.  Skin: Skin is warm and dry.  Psychiatric: She has a normal mood and affect. Her behavior is normal.     Assessment/Plan 41 yo female with morbid obesity -lap sleeve -ERAS -planned admission  Mickeal Skinner, MD 05/31/2017, 8:12 AM

## 2017-05-31 NOTE — Op Note (Signed)
Birdena JubileeSonya A Belleau [id number] Sep 23, 1975 05/31/2017  Preoperative diagnosis: undergoing sleeve gastrectomy  Postoperative diagnosis: Same   Procedure: Upper endoscopy   Surgeon: Susy FrizzleMatt B. Daphine DeutscherMartin  M.D., FACS   Anesthesia: Gen.   Indications for procedure: This patient was undergoing a sleeve gastrectomy.    Description of procedure: The endoscopy was placed in the mouth and into the oropharynx and under endoscopic vision it was advanced to the esophagogastric junction.  The pouch was insufflated and the scope passed into the antrum.  The pylorus was identified.  Cylindrical sleeve noted.  .   No bleeding or leaks were detected.  The scope was withdrawn without difficulty.     Matt B. Daphine DeutscherMartin, MD, FACS General, Bariatric, & Minimally Invasive Surgery [facility] [place]

## 2017-05-31 NOTE — Anesthesia Procedure Notes (Signed)
Procedure Name: Intubation Date/Time: 05/31/2017 8:36 AM Performed by: Deliah Boston, CRNA Pre-anesthesia Checklist: Patient identified, Emergency Drugs available, Suction available and Patient being monitored Patient Re-evaluated:Patient Re-evaluated prior to induction Oxygen Delivery Method: Circle system utilized Preoxygenation: Pre-oxygenation with 100% oxygen Induction Type: IV induction Ventilation: Mask ventilation without difficulty Laryngoscope Size: Mac and 4 Grade View: Grade II Tube type: Oral Tube size: 7.0 mm Number of attempts: 1 Airway Equipment and Method: Stylet and Oral airway Placement Confirmation: ETT inserted through vocal cords under direct vision,  positive ETCO2 and breath sounds checked- equal and bilateral Secured at: 23 cm Tube secured with: Tape Dental Injury: Teeth and Oropharynx as per pre-operative assessment

## 2017-05-31 NOTE — Progress Notes (Signed)
Discussed post op day goals with patient including ambulation, IS, diet progression, pain, and nausea control.  Questions answered. 

## 2017-05-31 NOTE — Discharge Instructions (Signed)

## 2017-05-31 NOTE — Anesthesia Postprocedure Evaluation (Signed)
Anesthesia Post Note  Patient: Taylr A Venier  Procedure(s) Performed: LAPAROSCOPIC GASTRIC SLEEVE RESECTION, UPPER ENDOSCOPY (N/A Abdomen)     Patient location during evaluation: PACU Anesthesia Type: General Level of consciousness: awake Pain management: pain level controlled Vital Signs Assessment: post-procedure vital signs reviewed and stable Respiratory status: spontaneous breathing Cardiovascular status: stable Postop Assessment: no apparent nausea or vomiting Anesthetic complications: no    Last Vitals:  Vitals:   05/31/17 1130 05/31/17 1155  BP: (!) 150/95 (!) 150/93  Pulse: 93 99  Resp: 17 14  Temp: 36.7 C 36.6 C  SpO2: 95% 96%    Last Pain:  Vitals:   05/31/17 1130  TempSrc:   PainSc: Asleep   Pain Goal: Patients Stated Pain Goal: 4 (05/31/17 16100638)               Allisyn Kunz JR,JOHN Susann GivensFRANKLIN

## 2017-05-31 NOTE — Op Note (Signed)
Preop Diagnosis: Obesity Class III  Postop Diagnosis: same  Procedure performed: laparoscopic Sleeve Gastrectomy  Assitant: Wenda LowMatt Martin  Indications:  The patient is a 41 y.o. year-old morbidly obese female who has been followed in the Bariatric Clinic as an outpatient. This patient was diagnosed with morbid obesity with a BMI of Body mass index is 45.17 kg/m. and significant co-morbidities including prediabetes and PCOS.  The patient was counseled extensively in the Bariatric Outpatient Clinic and after a thorough explanation of the risks and benefits of surgery (including death from complications, bowel leak, infection such as peritonitis and/or sepsis, internal hernia, bleeding, need for blood transfusion, bowel obstruction, organ failure, pulmonary embolus, deep venous thrombosis, wound infection, incisional hernia, skin breakdown, and others entailed on the consent form) and after a compliant diet and exercise program, the patient was scheduled for an elective laparoscopic sleeve gastrectomy.  Description of Operation:  Following informed consent, the patient was taken to the operating room and placed on the operating table in the supine position.  She had previously received prophylactic antibiotics and subcutaneous heparin for DVT prophylaxis in the pre-op holding area.  After induction of general endotracheal anesthesia by the anesthesiologist, the patient underwent placement of sequential compression devices, Foley catheter and an oro-gastric tube.  A timeout was confirmed by the surgery and anesthesia teams.  The patient was adequately padded at all pressure points and placed on a footboard to prevent slippage from the OR table during extremes of position during surgery.  She underwent a routine sterile prep and drape of her entire abdomen.    Next, A transverse incision was made under the left subcostal area and a 5mm optical viewing trocar was introduced into the peritoneal cavity.  Pneumoperitoneum was applied with a high flow and low pressure. A laparoscope was inserted to confirm placement. A extraperitoneal block was then placed at the lateral abdominal wall using exparel diluted with marcaine. 5 additional incisions were placed: 1 5mm trocar to the left of the midline. 1 additional 5mm trocar in the left lateral area, 1 12mm trocar in the right mid abdomen, 1 5mm trocar in the right subcostal area, and a Nathanson retractor was placed through a subxiphoid incision.  The fat pad at the GE junction was incised and the gastrodiaphragmatic ligament was divided using the Harmonic scalpel. Next, a hole was created through the lesser omentum along the greater curve of the stomach to enter the lesser sac. The vessels along the greater omentum were  Then ligated and divided using the Harmonic scalpel moving towards the spleen and then short gastric vessels were ligated and divided in the same fashion to fully mobilize the fundus. The left crus was identified to ensure completion of the dissection. Next the antrum was measured and dissection continued inferiorly along the greater curve towards the pylorus and stopped 6cm from the pylorus.   A 40Fr ViSiGi dilator was placed into the esophgaus and along the lesser curve of the stomach and placed on suction. 2 non-reinforced 60mm 4-415mm tristapler(s) followed by 3 60mm 3-284mm tristaplers were used to make the resection along the antrum being sure to stay well away from the angularis by angling the jaws of the stapler towards the greater curve and later completing the resection staying along the ViSiGi and ensuring the fundus was not retained by appropriately retracting it lateral. Air was inserted through the ViSiGi to perform a leak test showing no bubbles and a neutral lie of the stomach.  The assistant then went  and performed an upper endoscopy and leak test. No bubbles were seen and the sleeve and antrum distended appropriately. The specimen  was then placed in an endocatch bag and removed by the 15mm port. The fascia of the 15mm port was closed with a 0 vicryl by suture passer. Hemostasis was ensured. Pneumoperitoneum was evacuated, all ports were removed and all incisions closed with 4-0 monocryl suture in subcuticular fashion. Steristrips and bandaids were put in place for dressing. The patient awoke from anesthesia and was brought to pacu in stable condition. All counts were correct.  Estimated blood loss: <5730ml  Specimens:  Sleeve gastrectomy  Local Anesthesia: 50 ml Exparel:0.5% Marcaine mix  Post-Op Plan:       Pain Management: PO, prn      Antibiotics: Prophylactic      Anticoagulation: Prophylactic, Starting now      Post Op Studies/Consults: Not applicable      Intended Discharge: within 48h      Intended Outpatient Follow-Up: Two Week      Intended Outpatient Studies: Not Applicable      Other: Not Applicable   De BlanchLuke Aaron Kinsinger

## 2017-05-31 NOTE — Progress Notes (Signed)
Spoke to Dr. Sheliah HatchKinsinger via phone regarding no orders in epic for consent or medications.

## 2017-05-31 NOTE — Transfer of Care (Signed)
Immediate Anesthesia Transfer of Care Note  Patient: Abigail Duran  Procedure(s) Performed: Procedure(s): LAPAROSCOPIC GASTRIC SLEEVE RESECTION, UPPER ENDOSCOPY (N/A)  Patient Location: PACU  Anesthesia Type:General  Level of Consciousness: Patient easily awoken, sedated, comfortable, cooperative, following commands, responds to stimulation.   Airway & Oxygen Therapy: Patient spontaneously breathing, ventilating well, oxygen via simple oxygen mask.  Post-op Assessment: Report given to PACU RN, vital signs reviewed and stable, moving all extremities.   Post vital signs: Reviewed and stable.  Complications: No apparent anesthesia complications  Last Vitals:  Vitals:   05/31/17 0625  BP: (!) 152/90  Pulse: (!) 101  Resp: 18  Temp: 36.8 C  SpO2: 99%    Last Pain:  Vitals:   05/31/17 0625  TempSrc: Oral      Patients Stated Pain Goal: 4 (05/31/17 16100638)  Complications: No apparent anesthesia complications

## 2017-06-01 ENCOUNTER — Other Ambulatory Visit: Payer: Self-pay

## 2017-06-01 ENCOUNTER — Encounter (HOSPITAL_COMMUNITY): Payer: Self-pay | Admitting: General Surgery

## 2017-06-01 LAB — COMPREHENSIVE METABOLIC PANEL
ALBUMIN: 3.5 g/dL (ref 3.5–5.0)
ALK PHOS: 65 U/L (ref 38–126)
ALT: 32 U/L (ref 14–54)
AST: 26 U/L (ref 15–41)
Anion gap: 6 (ref 5–15)
BUN: 9 mg/dL (ref 6–20)
CALCIUM: 8.8 mg/dL — AB (ref 8.9–10.3)
CO2: 28 mmol/L (ref 22–32)
CREATININE: 0.7 mg/dL (ref 0.44–1.00)
Chloride: 102 mmol/L (ref 101–111)
GFR calc non Af Amer: >60 mL/min (ref >60)
GLUCOSE: 113 mg/dL — AB (ref 65–99)
Potassium: 4 mmol/L (ref 3.5–5.1)
SODIUM: 136 mmol/L (ref 135–145)
Total Bilirubin: 0.2 mg/dL — ABNORMAL LOW (ref 0.3–1.2)
Total Protein: 7.3 g/dL (ref 6.5–8.1)

## 2017-06-01 LAB — CBC WITH DIFFERENTIAL/PLATELET
Basophils Absolute: 0 10*3/uL (ref 0.0–0.1)
Basophils Relative: 0 %
EOS ABS: 0 10*3/uL (ref 0.0–0.7)
Eosinophils Relative: 0 %
HCT: 36.2 % (ref 36.0–46.0)
HEMOGLOBIN: 11.7 g/dL — AB (ref 12.0–15.0)
LYMPHS ABS: 1.3 10*3/uL (ref 0.7–4.0)
Lymphocytes Relative: 12 %
MCH: 26.9 pg (ref 26.0–34.0)
MCHC: 32.3 g/dL (ref 30.0–36.0)
MCV: 83.2 fL (ref 78.0–100.0)
Monocytes Absolute: 0.5 10*3/uL (ref 0.1–1.0)
Monocytes Relative: 5 %
NEUTROS PCT: 83 %
Neutro Abs: 8.5 10*3/uL — ABNORMAL HIGH (ref 1.7–7.7)
Platelets: 219 10*3/uL (ref 150–400)
RBC: 4.35 MIL/uL (ref 3.87–5.11)
RDW: 14.4 % (ref 11.5–15.5)
WBC: 10.2 10*3/uL (ref 4.0–10.5)

## 2017-06-01 NOTE — Discharge Summary (Signed)
Physician Discharge Summary  Abigail Duran XNA:[id number] DOB: April 19, 1976 DOA: 05/31/2017  PCP: Alycia Rossetti, MD  Admit date: 05/31/2017 Discharge date: 06/01/2017  Recommendations for Outpatient Follow-up:  1. (include homehealth, outpatient follow-up instructions, specific recommendations for PCP to follow-up on, etc.)  Follow-up Information    Kinsinger, Arta Bruce, MD. Go on 06/23/2017.   Specialty:  General Surgery Why:  at 0900 Contact information: [place] [address] [phone]        Kinsinger, Arta Bruce, MD Follow up.   Specialty:  General Surgery Contact information: [place] [address] [phone]          Discharge Diagnoses:  Active Problems:   Morbid obesity ([place])   Surgical Procedure: Laparoscopic Sleeve Gastrectomy, upper endoscopy  Discharge Condition: Good Disposition: Home  Diet recommendation: Postoperative sleeve gastrectomy diet (liquids only)  Filed Weights   05/31/17 0625  Weight: 115.7 kg (255 lb)     Hospital Course:  The patient was admitted after undergoing laparoscopic sleeve gastrectomy. POD 0 she ambulated well. POD 1 she was started on the water diet protocol and tolerated 350 ml in the first shift. Once meeting the water amount she was advanced to bariatric protein shakes which they tolerated and were discharged home POD 1.  Treatments: surgery: laparoscopic sleeve gastrectomy  Discharge Instructions  Discharge Instructions    Ambulate hourly while awake   Complete by:  As directed    Call MD for:  difficulty breathing, headache or visual disturbances   Complete by:  As directed    Call MD for:  persistant dizziness or light-headedness   Complete by:  As directed    Call MD for:  persistant nausea and vomiting   Complete by:  As directed    Call MD for:  redness, tenderness, or signs of infection (pain, swelling, redness, odor or green/yellow discharge around  incision site)   Complete by:  As directed    Call MD for:  severe uncontrolled pain   Complete by:  As directed    Call MD for:  temperature >101 F   Complete by:  As directed    Diet bariatric full liquid   Complete by:  As directed    Discharge wound care:   Complete by:  As directed    Remove Bandaids tomorrow, ok to shower tomorrow. Steristrips may fall off in 1-3 weeks.   Incentive spirometry   Complete by:  As directed    Perform hourly while awake     Allergies as of 06/01/2017      Reactions   Codeine Nausea And Vomiting      Medication List    TAKE these medications   acetaminophen 500 MG tablet Commonly known as:  TYLENOL Take 1,000 mg by mouth every 6 (six) hours as needed for moderate pain or headache.   Blood Glucose System Pak Kit Use to monitor FSBS 1x daily. Dx: E11.9   BLOOD GLUCOSE TEST STRIPS Strp Use to monitor FSBS 1x daily. Dx: E11.9   CALCIUM-VITAMIN D3 PO Take 1 tablet by mouth 3 (three) times daily.   clotrimazole-betamethasone cream Commonly known as:  LOTRISONE Apply 1 application topically 2 (two) times daily. What changed:    when to take this  reasons to take this   Lancets Misc Use to monitor FSBS 1x daily. Dx: E11.9   OVER THE COUNTER MEDICATION Take 1 tablet by mouth 3 (three) times daily. Bariatric Advantage  Chewable Multi-DA Mixed Fruit Supplement            Discharge Care Instructions  (From admission, onward)        Start     Ordered   06/01/17 0000  Discharge wound care:    Comments:  Remove Bandaids tomorrow, ok to shower tomorrow. Steristrips may fall off in 1-3 weeks.   06/01/17 1334     Follow-up Information    Kinsinger, Arta Bruce, MD. Go on 06/23/2017.   Specialty:  General Surgery Why:  at 0900 Contact information: [place] [address] [phone]        Kinsinger, Arta Bruce, MD Follow up.   Specialty:  General Surgery Contact information: [place] [address] [phone]            The results of significant diagnostics from this hospitalization (including imaging, microbiology, ancillary and laboratory) are listed below for reference.    Significant Diagnostic Studies: No results found.  Labs: Basic Metabolic Panel: Recent Labs  Lab 05/28/17 0838 05/31/17 1038 06/01/17 0520  NA 139  --  136  K 4.4  --  4.0  CL 105  --  102  CO2 27  --  28  GLUCOSE 116*  --  113*  BUN 12  --  9  CREATININE 0.78 0.76 0.70  CALCIUM 9.0  --  8.8*   Liver Function Tests: Recent Labs  Lab 06/01/17 0520  AST 26  ALT 32  ALKPHOS 65  BILITOT 0.2*  PROT 7.3  ALBUMIN 3.5    CBC: Recent Labs  Lab 05/28/17 0838 05/31/17 1038 06/01/17 0520  WBC 4.8 10.6* 10.2  NEUTROABS  --   --  8.5*  HGB 12.3 12.2 11.7*  HCT 37.3 36.7 36.2  MCV 83.6 83.2 83.2  PLT 238 217 219    CBG: Recent Labs  Lab 05/31/17 1100  GLUCAP 184*    Active Problems:   Morbid obesity ([place])   Time coordinating discharge: <9mn

## 2017-06-01 NOTE — Progress Notes (Signed)
   Progress Note: Metabolic and Bariatric Surgery Service   Chief Complaint/Subjective: Pain well controlled, ambulating well, tolerating water  Objective: Vital signs in last 24 hours: Temp:  [97.6 F (36.4 C)-99.5 F (37.5 C)] 98.2 F (36.8 C) (12/04 0538) Pulse Rate:  [84-102] 88 (12/04 0538) Resp:  [14-22] 18 (12/04 0538) BP: (138-158)/(79-98) 145/92 (12/04 0538) SpO2:  [93 %-99 %] 98 % (12/04 0538) Last BM Date: 05/30/17  Intake/Output from previous day: 12/03 0701 - 12/04 0700 In: 3333.8 [P.O.:240; I.V.:3093.8] Out: 3115 [Urine:3100; Blood:15] Intake/Output this shift: Total I/O In: 60 [P.O.:60] Out: -   Lungs: CTAB  Cardiovascular: RRR  Abd: soft, ATTP, ND  Extremities: no edema  Neuro: AOx4  Lab Results: CBC  Recent Labs    05/31/17 1038 06/01/17 0520  WBC 10.6* 10.2  HGB 12.2 11.7*  HCT 36.7 36.2  PLT 217 219   BMET Recent Labs    05/31/17 1038 06/01/17 0520  NA  --  136  K  --  4.0  CL  --  102  CO2  --  28  GLUCOSE  --  113*  BUN  --  9  CREATININE 0.76 0.70  CALCIUM  --  8.8*   PT/INR No results for input(s): LABPROT, INR in the last 72 hours. ABG No results for input(s): PHART, HCO3 in the last 72 hours.  Invalid input(s): PCO2, PO2  Studies/Results:  Anti-infectives: Anti-infectives (From admission, onward)   Start     Dose/Rate Route Frequency Ordered Stop   05/31/17 0759  cefoTEtan in Dextrose 5% (CEFOTAN) 2-2.08 GM-%(50ML) IVPB    Comments:  Randon GoldsmithJones, Rachel   : cabinet override      05/31/17 0759 05/31/17 0837   05/31/17 0645  cefoTEtan in Dextrose 5% (CEFOTAN) IVPB 2 g     2 g Intravenous On call to O.R. 05/31/17 [id number] 05/31/17 0837      Medications: Scheduled Meds: . enoxaparin (LOVENOX) injection  30 mg Subcutaneous Q12H  . pantoprazole (PROTONIX) IV  40 mg Intravenous QHS  . protein supplement shake  2 oz Oral Q2H   Continuous Infusions: . sodium chloride 125 mL/hr at 06/01/17 0526   PRN Meds:.acetaminophen  (TYLENOL) oral liquid 160 mg/5 mL, hydrALAZINE, morphine injection, ondansetron (ZOFRAN) IV, oxyCODONE, simethicone, traMADol  Assessment/Plan: Patient Active Problem List   Diagnosis Date Noted  . Tinea versicolor 04/24/2015  . Glucose intolerance (impaired glucose tolerance) 03/05/2015  . Morbid obesity ([facility]) 08/28/2014  . Acanthosis nigricans 08/28/2014  . History of PCOS 08/28/2014  . History of abnormal cervical Pap smear 08/28/2014  . Elevated blood pressure reading without diagnosis of hypertension 08/28/2014  . CIN I (cervical intraepithelial neoplasia I) 03/16/2012  . Contraception management 03/16/2012   s/p Procedure(s): LAPAROSCOPIC GASTRIC SLEEVE RESECTION, UPPER ENDOSCOPY 05/31/2017 -continue protocol  Disposition:  LOS: 1 day  The patient will be in the hospital for normal postop protocol  Rodman PickleLuke Aaron Trana Ressler, MD [phone] [facility]

## 2017-06-01 NOTE — Progress Notes (Signed)
Patient alert and oriented, pain is controlled. Patient is tolerating fluids, advanced to protein shake today, patient is tolerating well.  Reviewed Gastric sleeve discharge instructions with patient and patient is able to articulate understanding.  Provided information on BELT program, Support Group and [facility] outpatient pharmacy. All questions answered, will continue to monitor.  

## 2017-06-01 NOTE — Progress Notes (Signed)
Patient alert and oriented, Post op day 1.  Provided support and encouragement.  Encouraged pulmonary toilet, ambulation and small sips of liquids.  Patient completed 10 ounces of clear fluid.  Continues to work toward fluid goals. All questions answered.  Will continue to monitor.

## 2017-06-01 NOTE — Progress Notes (Signed)
Discharge instructions reviewed with patient. Questions answered. Patient denies further questions. No prescriptions given. Aunt is here to drive patient home. Lina SarBeth Lamika Connolly, RN

## 2017-06-03 ENCOUNTER — Encounter: Payer: Self-pay | Admitting: *Deleted

## 2017-06-03 ENCOUNTER — Other Ambulatory Visit: Payer: Self-pay | Admitting: *Deleted

## 2017-06-03 NOTE — Patient Outreach (Signed)
[facility] Care Management  06/03/2017  [name] [birth year] [id number]   Subjective: Telephone call to patient's home / mobile number, spoke with patient, and HIPAA verified.  Discussed [facility] Transition of care follow up, patient voiced understanding, and is in agreement to follow up.    Patient states she doing okay, had bad gas pains last night, gas is less / better  today, she will call surgeon's office regarding liquid Tylenol question, and  has a follow up appointment with surgeon on 06/23/17.  States staying at home of her aunt, she is able to self manage care,  and has assistance from family as needed for activities of daily living / home management.  RNCM educated patient on the importance of mobility to assist with gas pain, voices understanding, and states she will follow up as needed. Patient voices understanding of medical diagnosis, surgery,  and treatment plan.  [facility] benefits discussed on 05/28/17 preoperative call and patient states no additional questions at this time.  Patient states she does not have any education material, transition of care, care coordination, disease management, disease monitoring, transportation, community resource, or pharmacy needs at this time.  States she is very appreciative of the follow up and is in agreement to receive [facility] information.    Objective:Per KPN (Knowledge Performance Now, point of care tool) and chart review,patient [facility]  05/31/17 [facility].   Status post LAPAROSCOPIC GASTRIC SLEEVE RESECTION, UPPER ENDOSCOPY  on 05/31/17.   Patient has a history of prediabetes and Polycystic ovary syndrome (PCOS).      Assessment: Received [facility] Transition of care referral on 05/24/17.Preoperative call completed, and Transition of care follow up completed, no care management needs, and will proceed with case closure.      Plan:RNCM will send  patient successful outreach letter, [facility] pamph[facility], and magnet. RNCM will send case closure due to follow up completed / no care management needs request to Iverson AlaminLaura Greeson at [facility].     Gabby Rackers H. Gardiner Barefootooper RN, BSN, CCM [facility] Care Management [facility] Telephonic CM Phone: [phone] Fax: [phone]

## 2017-06-04 ENCOUNTER — Telehealth (HOSPITAL_COMMUNITY): Payer: Self-pay

## 2017-06-04 NOTE — Telephone Encounter (Signed)
Follow up with bariatric surgical patient to discuss post discharge questions  1.  Are you having any pain not relieved by pain medication?No pain medication is working taking medication at night, tylenol during the day  2.  How much fluid total fluid intake have you had in the last 24/48 hours?  40 + ounces of total fluid  3.  How much protein intake have you had in the last 24/48 hours?30 grams of protein using premiere protein shakes  4.  Have you had any trouble making urine?No patient states she makes plenty of urine  5.  Have you had nausea that has not been relieved by nausea medication?no nausea  6.  Are you ambulating every hour?yes she is up every hour  7.  Are you passing gas or had a BM?yes no BM encourgaed to try MOM or Miralax   8.  Do you know how to contact [facility]? [facility]? [facility]?patient able to tell me how to contact all resources  9.  Are you taking your vitamins and calcium without difficulty?yes no problems with vitamins or calcium  10. Tell me how your incision looks?  Any redness, open incision, or drainage? They look lost one steri strip, explained that steri strips will come off the next 3 weeks and it is ok for them to fall off

## 2017-06-15 ENCOUNTER — Encounter: Payer: 59 | Attending: General Surgery | Admitting: Skilled Nursing Facility1

## 2017-06-15 DIAGNOSIS — Z713 Dietary counseling and surveillance: Secondary | ICD-10-CM | POA: Diagnosis not present

## 2017-06-15 DIAGNOSIS — E282 Polycystic ovarian syndrome: Secondary | ICD-10-CM | POA: Insufficient documentation

## 2017-06-15 DIAGNOSIS — E669 Obesity, unspecified: Secondary | ICD-10-CM | POA: Diagnosis present

## 2017-06-16 ENCOUNTER — Encounter: Payer: Self-pay | Admitting: Skilled Nursing Facility1

## 2017-06-16 NOTE — Progress Notes (Signed)
Bariatric Class:  Appt start time: 1530 end time:  1630.  2 Week Post-Operative Nutrition Class  Patient was seen on 06/15/2017 for Post[facility]ative [facility]   Surgery date: 05/31/2017 Surgery type: Sleeve Start weight at [facility]: 252 Weight today: 238.2  TANITA  BODY COMP RESULTS  06/15/2017   BMI (kg/m^2) 42.2   Fat Mass (lbs) 121.4   Fat Free Mass (lbs) 116.8   Total Body Water (lbs) 85.8   The following the learning objectives were met by the patient during this course:  Identifies Phase 3A (Soft, High Proteins) Dietary Goals and will begin from 2 weeks post-operatively to 2 months post-operatively  Identifies appropriate sources of fluids and proteins   States protein recommendations and appropriate sources post-operatively  Identifies the need for appropriate texture modifications, mastication, and bite sizes when consuming solids  Identifies appropriate multivitamin and calcium sources post-operatively  Describes the need for physical activity post-operatively and will follow MD recommendations  States when to call healthcare provider regarding medication questions or post-operative complications  Handouts given during class include:  Phase 3A: Soft, High Protein Diet Handout  Follow-Up Plan: Patient will follow-up at [facility] in 6 weeks for 2 month post-op nutrition visit for diet advancement per MD.

## 2017-07-28 ENCOUNTER — Encounter: Payer: Self-pay | Admitting: Skilled Nursing Facility1

## 2017-07-28 ENCOUNTER — Encounter: Payer: 59 | Attending: General Surgery | Admitting: Skilled Nursing Facility1

## 2017-07-28 DIAGNOSIS — E282 Polycystic ovarian syndrome: Secondary | ICD-10-CM | POA: Diagnosis not present

## 2017-07-28 DIAGNOSIS — Z713 Dietary counseling and surveillance: Secondary | ICD-10-CM | POA: Insufficient documentation

## 2017-07-28 DIAGNOSIS — E669 Obesity, unspecified: Secondary | ICD-10-CM | POA: Diagnosis present

## 2017-07-28 NOTE — Progress Notes (Signed)
Follow-up visit:  8 Weeks Post-Operative Sleeve Surgery  Primary concerns today: Post-operative Bariatric Surgery Nutrition Management.   Pt states she is struggling because she has not been hungry and has been struggling to get enough fluids in. Pt states her boss had the surgery at the same time so they are trying to encourage each other to drink more. Pt states she wants to work towards eating whole foods in the morning. Pt states she really enjoys cheese but has not been eating it.  Pt declined the tanita.   Surgery date: 05/31/2017 Surgery type: [facility]leeve [facility] Weight today: 222.9 Wt Change: 15.3  TANITA  BODY COMP RESULTS  06/15/2017   BMI (kg/m^2) 42.2   Fat Mass (lbs) 121.4   Fat Free Mass (lbs) 116.8   Total Body Water (lbs) 85.8   24-hr recall: B (AM): protein shake (not a breakfast person) Snk (AM): L (PM): grilled chicken and broccoli  Snk (PM):  D (PM): 3.5 ounces chicken and salad Snk (PM):   Fluid intake: 60 ounces flavored waters  Estimated total protein intake: 60  Medications: See List Supplementation: flinstones and tums 1 time a day   Using straws: no Drinking while eating: was with pain  Having you been chewing well:no Chewing/swallowing difficulties: no Changes in vision: no Changes to mood/headaches: no Hair loss/Cahnges to skin/Changes to nails: no Any difficulty focusing or concentrating: no Sweating: no Dizziness/Lightheaded: no Palpitations: no  Carbonated beverages: no N/V/D/C/GAS: constipation: having a bowel movement once a week Abdominal Pain: no Dumping syndrome: no  Recent physical activity:  3 days a week 60 minutes   Progress Towards Goal(s):  In progress.  Handouts given during visit include:  Non-starchy veggies + protein   Nutritional Diagnosis:  [place]-3.3 Overweight/obesity related to past poor dietary habits and physical inactivity as evidenced by patient w/ recent sleeve surgery following dietary  guidelines for continued weight loss.  Intervention:  Nutrition counseling. Dietitian educated the pt on advancing her diet ot include non-starchy vegetables. Goals: -Try to have your protein shake in the morning within 30 minutes  -Do not do protein drinks beyond breakfast -Try to have vegetables with every lunch and every dinner every day -Get the bariatric multivitamin capsule  -Try kefir in the yogurt aisle   Teaching Method Utilized:  Visual Auditory Hands on  Barriers to learning/adherence to lifestyle change: none identified   Demonstrated degree of understanding via:  Teach Back   Monitoring/Evaluation:  Dietary intake, exercise, and body weight.

## 2017-07-28 NOTE — Patient Instructions (Addendum)
-  Try to have your protein shake in the morning within 30 minutes   -Do not do protein drinks beyond breakfast  -Try to have vegetables with every lunch and every dinner every day  -Get the bariatric multivitamin capsule   -Try kefir in the yogurt aisle

## 2017-09-27 ENCOUNTER — Encounter: Payer: Self-pay | Admitting: Family Medicine

## 2017-09-27 ENCOUNTER — Other Ambulatory Visit: Payer: Self-pay

## 2017-09-27 ENCOUNTER — Ambulatory Visit (INDEPENDENT_AMBULATORY_CARE_PROVIDER_SITE_OTHER): Payer: 59 | Admitting: Family Medicine

## 2017-09-27 VITALS — BP 122/74 | HR 86 | Temp 98.5°F | Resp 14 | Ht 63.0 in | Wt 206.0 lb

## 2017-09-27 DIAGNOSIS — R7302 Impaired glucose tolerance (oral): Secondary | ICD-10-CM

## 2017-09-27 DIAGNOSIS — Z6836 Body mass index (BMI) 36.0-36.9, adult: Secondary | ICD-10-CM | POA: Diagnosis not present

## 2017-09-27 DIAGNOSIS — Z113 Encounter for screening for infections with a predominantly sexual mode of transmission: Secondary | ICD-10-CM

## 2017-09-27 DIAGNOSIS — Z124 Encounter for screening for malignant neoplasm of cervix: Secondary | ICD-10-CM | POA: Diagnosis not present

## 2017-09-27 DIAGNOSIS — Z0000 Encounter for general adult medical examination without abnormal findings: Secondary | ICD-10-CM | POA: Diagnosis not present

## 2017-09-27 DIAGNOSIS — B360 Pityriasis versicolor: Secondary | ICD-10-CM

## 2017-09-27 LAB — WET PREP FOR TRICH, YEAST, CLUE

## 2017-09-27 MED ORDER — CLOTRIMAZOLE-BETAMETHASONE 1-0.05 % EX CREA: 1.0000 "application " | TOPICAL_CREAM | Freq: Two times a day (BID) | CUTANEOUS | 2 refills | Status: DC

## 2017-09-27 NOTE — Progress Notes (Signed)
   Subjective:    Patient ID: Abigail Duran, female    DOB: 1976-02-05, 42 y.o.   MRN: [id number]  Patient presents for CPE (is not fasting- defer PAP)   Pt here for CPE  December 3rd had gastric sleeve- Dr Sheliah HatchKinsinger, weight 255lbs before surgery, down 50lbs, no N/V,bowels are slower, takes colace   taking vitamins, uses protein shakes some days, now eating regular   Now exercising, seeing nutritionist  Has f/u April 17th     was borderline DM last A1C 6.2%  Menses is now more regular    Due for PAP Smear , history of abnormal PAP last in 2016   Due for mammogram age 89 this summer     Tinea versicolor no recent outbreaks ,request refill to have on hand       Review Of Systems:  GEN- denies fatigue, fever, weight loss,weakness, recent illness HEENT- denies eye drainage, change in vision, nasal discharge, CVS- denies chest pain, palpitations RESP- denies SOB, cough, wheeze ABD- denies N/V, change in stools, abd pain GU- denies dysuria, hematuria, dribbling, incontinence MSK- denies joint pain, muscle aches, injury Neuro- denies headache, dizziness, syncope, seizure activity       Objective:    BP 122/74   Pulse 86   Temp 98.5 F (36.9 C) (Oral)   Resp 14   Ht 5\' 3"  (1.6 m)   Wt 206 lb (93.4 kg)   SpO2 99%   BMI 36.49 kg/m  GEN- NAD, alert and oriented x3 HEENT- PERRL, EOMI, non injected sclera, pink conjunctiva, MMM, oropharynx clear Neck- Supple, no thyromegaly Breast- normal symmetry, no nipple inversion,no nipple drainage, no nodules or lumps felt Nodes- no axillary nodes CVS- RRR, no murmur RESP-CTAB ABD-NABS,soft,NT,ND GU- normal external genitalia, vaginal mucosa pink and moist, cervix visualized no growth, no blood form os, minimal thin clear discharge, no CMT, no ovarian masses, uterus normal size EXT- No edema Pulses- Radial, DP- 2+        Assessment & Plan:      Problem List Items Addressed This Visit      Unprioritized   Glucose  intolerance (impaired glucose tolerance)   Relevant Orders   Hemoglobin A1c   Tinea versicolor   Obesity    Doing well s/p surgery Should correct her borderline DM as well PCOS symptoms improved, menses regular        Other Visit Diagnoses    Routine general medical examination at a health care facility    -  Primary   CPE done, fasting labs done. PAP Smear. STD screen, refilled lotrisone. s/p bariatric surgery with good weight loss, exercising, following diet plan   Relevant Orders   CBC with Differential/Platelet   Comprehensive metabolic panel   Lipid panel   Cervical cancer screening       Relevant Orders   Pap IG w/ reflex to HPV when ASC-U   Screen for STD (sexually transmitted disease)       Relevant Orders   C. trachomatis/N. gonorrhoeae RNA   WET PREP FOR TRICH, YEAST, CLUE (Completed)   HIV antibody   RPR      Note: This dictation was prepared with Dragon dictation along with smaller phrase technology. Any transcriptional errors that result from this process are unintentional.

## 2017-09-27 NOTE — Assessment & Plan Note (Signed)
Doing well s/p surgery Should correct her borderline DM as well PCOS symptoms improved, menses regular

## 2017-09-27 NOTE — Patient Instructions (Signed)
Schedule a mammogram at age  42 I recommend eye visit once a year I recommend dental visit every 6 months Goal is to  Exercise 30 minutes 5 days a week We will send a letter with lab results  F/U 6 MONTHS

## 2017-09-28 ENCOUNTER — Ambulatory Visit: Payer: 59 | Admitting: Skilled Nursing Facility1

## 2017-09-28 ENCOUNTER — Telehealth: Payer: Self-pay | Admitting: *Deleted

## 2017-09-28 LAB — COMPREHENSIVE METABOLIC PANEL
AG Ratio: 1.5 (calc) (ref 1.0–2.5)
ALT: 6 U/L (ref 6–29)
AST: 12 U/L (ref 10–30)
Albumin: 3.9 g/dL (ref 3.6–5.1)
Alkaline phosphatase (APISO): 66 U/L (ref 33–115)
BUN: 12 mg/dL (ref 7–25)
CALCIUM: 9.1 mg/dL (ref 8.6–10.2)
CO2: 27 mmol/L (ref 20–32)
Chloride: 104 mmol/L (ref 98–110)
Creat: 0.72 mg/dL (ref 0.50–1.10)
GLUCOSE: 86 mg/dL (ref 65–99)
Globulin: 2.6 g/dL (calc) (ref 1.9–3.7)
Potassium: 3.9 mmol/L (ref 3.5–5.3)
SODIUM: 139 mmol/L (ref 135–146)
TOTAL PROTEIN: 6.5 g/dL (ref 6.1–8.1)
Total Bilirubin: 0.4 mg/dL (ref 0.2–1.2)

## 2017-09-28 LAB — CBC WITH DIFFERENTIAL/PLATELET
BASOS ABS: 29 {cells}/uL (ref 0–200)
Basophils Relative: 0.7 %
EOS PCT: 1.5 %
Eosinophils Absolute: 62 cells/uL (ref 15–500)
HCT: 36.2 % (ref 35.0–45.0)
Hemoglobin: 11.8 g/dL (ref 11.7–15.5)
Lymphs Abs: 2353 cells/uL (ref 850–3900)
MCH: 27.3 pg (ref 27.0–33.0)
MCHC: 32.6 g/dL (ref 32.0–36.0)
MCV: 83.6 fL (ref 80.0–100.0)
MONOS PCT: 7.6 %
MPV: 12.7 fL — ABNORMAL HIGH (ref 7.5–12.5)
NEUTROS ABS: 1345 {cells}/uL — AB (ref 1500–7800)
NEUTROS PCT: 32.8 %
PLATELETS: 200 10*3/uL (ref 140–400)
RBC: 4.33 10*6/uL (ref 3.80–5.10)
RDW: 14.3 % (ref 11.0–15.0)
TOTAL LYMPHOCYTE: 57.4 %
WBC mixed population: 312 cells/uL (ref 200–950)
WBC: 4.1 10*3/uL (ref 3.8–10.8)

## 2017-09-28 LAB — LIPID PANEL
Cholesterol: 185 mg/dL (ref <200)
HDL: 52 mg/dL (ref >50)
LDL CHOLESTEROL (CALC): 115 mg/dL — AB
NON-HDL CHOLESTEROL (CALC): 133 mg/dL — AB (ref <130)
TRIGLYCERIDES: 84 mg/dL (ref <150)
Total CHOL/HDL Ratio: 3.6 (calc) (ref <5.0)

## 2017-09-28 LAB — RPR: RPR: NONREACTIVE

## 2017-09-28 LAB — HEMOGLOBIN A1C
HEMOGLOBIN A1C: 5.6 %{Hb} (ref <5.7)
Mean Plasma Glucose: 114 (calc)
eAG (mmol/L): 6.3 (calc)

## 2017-09-28 LAB — C. TRACHOMATIS/N. GONORRHOEAE RNA
C. TRACHOMATIS RNA, TMA: NOT DETECTED
N. GONORRHOEAE RNA, TMA: NOT DETECTED

## 2017-09-28 LAB — HIV ANTIBODY (ROUTINE TESTING W REFLEX): HIV 1&2 Ab, 4th Generation: NONREACTIVE

## 2017-09-28 NOTE — Telephone Encounter (Signed)
Received call from [place] with [facility]. [phone]~ telephone.   Was advised that PAP cold not be run D/T container was out of date. Test must be recollected.   MD to be made aware.

## 2017-09-28 NOTE — Telephone Encounter (Signed)
Call pt  PP needs to be recollected STD screen neg No longer borderline diabetic Cholesterol has improved No new meds

## 2017-09-28 NOTE — Telephone Encounter (Signed)
Call placed to patient and patient made aware.   Appointment scheduled.  

## 2017-10-07 ENCOUNTER — Encounter: Payer: Self-pay | Admitting: Skilled Nursing Facility1

## 2017-10-07 ENCOUNTER — Encounter: Payer: 59 | Attending: General Surgery | Admitting: Skilled Nursing Facility1

## 2017-10-07 DIAGNOSIS — Z713 Dietary counseling and surveillance: Secondary | ICD-10-CM | POA: Diagnosis not present

## 2017-10-07 DIAGNOSIS — E669 Obesity, unspecified: Secondary | ICD-10-CM | POA: Diagnosis present

## 2017-10-07 DIAGNOSIS — E282 Polycystic ovarian syndrome: Secondary | ICD-10-CM | POA: Insufficient documentation

## 2017-10-07 DIAGNOSIS — Z6836 Body mass index (BMI) 36.0-36.9, adult: Secondary | ICD-10-CM

## 2017-10-07 NOTE — Progress Notes (Signed)
Post-Operative Sleeve Surgery  Primary concerns today: Post-operative Bariatric Surgery Nutrition Management. Pt states her boss had the surgery at the same time so they are trying to encourage each other to drink more.  Pt declined the tanita.  Pts A1C has decreased to 5.6. Pt states between 1-3am she wakes up hungry with her stomach growling sometimes eating something sometimes just getting water. Pt states she is trying not toe drink protein shakes at all. Pt states she had apple juice and had no issues. Pt states she is going to [place] and plans to drink: Dietitian educated the pt on this. Pt states she no longer has back pain and is walking with more breath.   Surgery date: 05/31/2017 Surgery type: Sleeve [facility] Weight today: 205.6 Wt Change: 17.3  TANITA  BODY COMP RESULTS  06/15/2017   BMI (kg/m^2) 42.2   Fat Mass (lbs) 121.4   Fat Free Mass (lbs) 116.8   Total Body Water (lbs) 85.8   24-hr recall: B (AM): boiled egg and half piece of bacon Snk (AM): L (PM): grilled chicken and broccoli or pot roast with carrots and tasted potato Snk (PM):  D (PM): 3.5 ounces chicken and salad or broccoli and chicken stir fry and 4 grapes Snk (PM):   Fluid intake: 60 ounces flavored waters  Estimated total protein intake: 60  Medications: See List Supplementation: gummy from [employer] and tums 1 time a day   Using straws: no Drinking while eating: was with pain  Having you been chewing well:no Chewing/swallowing difficulties: no Changes in vision: no Changes to mood/headaches: no Hair loss/Cahnges to skin/Changes to nails: no Any difficulty focusing or concentrating: no Sweating: no Dizziness/Lightheaded: no Palpitations: no  Carbonated beverages: no N/V/D/C/GAS: constipation: having a bowel movement once a week, taking colace Abdominal Pain: no Dumping syndrome: no  Recent physical activity:  3 days a week 60 minutes   Progress Towards Goal(s):  In  progress.  Handouts given during visit include:  Non-starchy veggies + protein   Nutritional Diagnosis:  [place]-3.3 Overweight/obesity related to past poor dietary habits and physical inactivity as evidenced by patient w/ recent sleeve surgery following dietary guidelines for continued weight loss.  Intervention:  Nutrition counseling. Dietitian educated the pt on advancing her diet ot include non-starchy vegetables. Goals: -Add 1 high protein snack in the day and 1 fruit snack  -Pay attention to your stressors and how you cope with them -Try kefir  -Have beans 3 times a week  -Aim to get in 60-70 fluid ounces -Try the multivitamin capsule in the afternoon with food on your stomach  -Aim to have at least 2 calcium's in a day -Try the protein shake in your coffee   Teaching Method Utilized:  Visual Auditory Hands on  Barriers to learning/adherence to lifestyle change: none identified   Demonstrated degree of understanding via:  Teach Back   Monitoring/Evaluation:  Dietary intake, exercise, and body weight.

## 2017-10-07 NOTE — Patient Instructions (Addendum)
-  Add 1 high protein snack in the day and 1 fruit snack   -Pay attention to your stressors and how you cope with them  -Try kefir   -Have beans 3 times a week   -Aim to get in 60-70 fluid ounces  -Try the multivitamin capsule in the afternoon with food on your stomach   -Aim to have at least 2 calcium's in a day  -Try the protein shake in your coffee

## 2017-10-11 ENCOUNTER — Other Ambulatory Visit: Payer: Self-pay | Admitting: Family Medicine

## 2017-11-10 DIAGNOSIS — E669 Obesity, unspecified: Secondary | ICD-10-CM | POA: Diagnosis not present

## 2017-11-10 DIAGNOSIS — R69 Illness, unspecified: Secondary | ICD-10-CM | POA: Diagnosis not present

## 2017-11-10 DIAGNOSIS — K912 Postsurgical malabsorption, not elsewhere classified: Secondary | ICD-10-CM | POA: Diagnosis not present

## 2017-11-10 DIAGNOSIS — Z9884 Bariatric surgery status: Secondary | ICD-10-CM | POA: Diagnosis not present

## 2017-12-07 ENCOUNTER — Ambulatory Visit: Payer: 59 | Admitting: Skilled Nursing Facility1

## 2018-01-13 ENCOUNTER — Ambulatory Visit: Payer: 59 | Admitting: Skilled Nursing Facility1

## 2018-01-25 ENCOUNTER — Encounter: Payer: Self-pay | Admitting: Family Medicine

## 2018-01-26 ENCOUNTER — Ambulatory Visit (INDEPENDENT_AMBULATORY_CARE_PROVIDER_SITE_OTHER): Payer: 59 | Admitting: Family Medicine

## 2018-01-26 ENCOUNTER — Encounter: Payer: Self-pay | Admitting: Family Medicine

## 2018-01-26 ENCOUNTER — Other Ambulatory Visit: Payer: Self-pay

## 2018-01-26 VITALS — BP 118/72 | HR 80 | Temp 98.3°F | Resp 14 | Ht 63.0 in | Wt 204.0 lb

## 2018-01-26 DIAGNOSIS — N898 Other specified noninflammatory disorders of vagina: Secondary | ICD-10-CM | POA: Diagnosis not present

## 2018-01-26 DIAGNOSIS — Z1239 Encounter for other screening for malignant neoplasm of breast: Secondary | ICD-10-CM

## 2018-01-26 DIAGNOSIS — Z124 Encounter for screening for malignant neoplasm of cervix: Secondary | ICD-10-CM | POA: Diagnosis not present

## 2018-01-26 DIAGNOSIS — Z1231 Encounter for screening mammogram for malignant neoplasm of breast: Secondary | ICD-10-CM

## 2018-01-26 LAB — WET PREP FOR TRICH, YEAST, CLUE

## 2018-01-26 NOTE — Patient Instructions (Signed)
F/U 6 months

## 2018-01-26 NOTE — Progress Notes (Signed)
   Subjective:    Patient ID: Abigail Duran, female    DOB: September 28, 1975, 42 y.o.   MRN: [id number]  Patient presents for PAP (is fasting)   Pt here for PAP Smear , the test was expired in April   Getting a thick discharge at the beginning of the month before her period  no itching or burning sensation  Had neg GC recently  Periods are now regular since she has lost the weight    Has thickened nails, but wants to keep polishing for now, she was concerned about possible fungus   Review Of Systems:  GEN- denies fatigue, fever, weight loss,weakness, recent illness HEENT- denies eye drainage, change in vision, nasal discharge, CVS- denies chest pain, palpitations RESP- denies SOB, cough, wheeze ABD- denies N/V, change in stools, abd pain GU- denies dysuria, hematuria, dribbling, incontinence MSK- denies joint pain, muscle aches, injury Neuro- denies headache, dizziness, syncope, seizure activity       Objective:    BP 118/72   Pulse 80   Temp 98.3 F (36.8 C) (Oral)   Resp 14   Ht 5\' 3"  (1.6 m)   Wt 204 lb (92.5 kg)   SpO2 100%   BMI 36.14 kg/m  GEN- NAD, alert and oriented x3 CVS- RRR, no murmur RESP-CTAB ABD-NABS,soft,NT,ND GU- normal external genitalia, vaginal mucosa pink and moist, cervix visualized no growth, no blood form os, minimal thin clear discharge, no CMT, no ovarian masses, uterus normal size EXT- No edema Pulses- Radial  2+        Assessment & Plan:      Problem List Items Addressed This Visit    None    Visit Diagnoses    Cervical cancer screening    -  Primary   PAP done, pt to schedule mammogram. Does not want to remove polish on nails for now   Relevant Orders   Pap IG w/ reflex to HPV when ASC-U (Completed)   Vaginal discharge       Check wet prep, likley physiologic with minimal symptoms and around period   Relevant Orders   WET PREP FOR [place], YEAST, CLUE (Completed)   Breast cancer screening       Relevant Orders   MS DIGITAL  SCREENING TOMO BILATERAL      Note: This dictation was prepared with Dragon dictation along with smaller phrase technology. Any transcriptional errors that result from this process are unintentional.

## 2018-01-27 ENCOUNTER — Encounter: Payer: Self-pay | Admitting: Family Medicine

## 2018-01-27 LAB — PAP IG W/ RFLX HPV ASCU

## 2018-03-16 ENCOUNTER — Other Ambulatory Visit: Payer: Self-pay | Admitting: Family Medicine

## 2018-03-16 DIAGNOSIS — Z1231 Encounter for screening mammogram for malignant neoplasm of breast: Secondary | ICD-10-CM

## 2018-03-29 ENCOUNTER — Ambulatory Visit: Payer: 59 | Admitting: Family Medicine

## 2018-04-11 ENCOUNTER — Ambulatory Visit: Payer: 59

## 2018-07-19 ENCOUNTER — Ambulatory Visit: Payer: 59

## 2018-08-16 ENCOUNTER — Ambulatory Visit: Payer: Self-pay | Admitting: Skilled Nursing Facility1

## 2018-12-05 ENCOUNTER — Other Ambulatory Visit: Payer: Self-pay

## 2018-12-05 ENCOUNTER — Ambulatory Visit (INDEPENDENT_AMBULATORY_CARE_PROVIDER_SITE_OTHER): Payer: No Typology Code available for payment source | Admitting: Family Medicine

## 2018-12-05 VITALS — BP 126/76 | HR 95 | Temp 98.5°F | Resp 18 | Ht 63.0 in | Wt 217.6 lb

## 2018-12-05 DIAGNOSIS — B9689 Other specified bacterial agents as the cause of diseases classified elsewhere: Secondary | ICD-10-CM | POA: Diagnosis not present

## 2018-12-05 DIAGNOSIS — R112 Nausea with vomiting, unspecified: Secondary | ICD-10-CM

## 2018-12-05 DIAGNOSIS — N760 Acute vaginitis: Secondary | ICD-10-CM | POA: Diagnosis not present

## 2018-12-05 DIAGNOSIS — Z113 Encounter for screening for infections with a predominantly sexual mode of transmission: Secondary | ICD-10-CM | POA: Diagnosis not present

## 2018-12-05 LAB — WET PREP FOR TRICH, YEAST, CLUE

## 2018-12-05 LAB — PREGNANCY, URINE: Preg Test, Ur: NEGATIVE

## 2018-12-05 MED ORDER — FLUCONAZOLE 150 MG PO TABS: 150.0000 mg | ORAL_TABLET | Freq: Once | ORAL | 0 refills | Status: AC

## 2018-12-05 MED ORDER — METRONIDAZOLE 500 MG PO TABS: 500.0000 mg | ORAL_TABLET | Freq: Two times a day (BID) | ORAL | 0 refills | Status: DC

## 2018-12-05 NOTE — Patient Instructions (Addendum)
F/u schedule Physical  We will call with lab results

## 2018-12-05 NOTE — Progress Notes (Signed)
   Subjective:    Patient ID: Abigail Duran, female    DOB: 1976-04-23, 43 y.o.   MRN: [id number]  Patient presents for Vaginitis (and pregnancy test)     She is sexually active. Has had Nausea Vomiting about 10 days ago, not sure if due to pizza she ate but would like pregnancy test, no current symptoms, no current abd pain   Past few days has had itching in vaginal region, used Monistat but had intense pain with this, and removed Has been taking probiotics as well  Had 1 diflucan last wed   No UTI symptoms  No other medications    Review Of Systems:  GEN- denies fatigue, fever, weight loss,weakness, recent illness HEENT- denies eye drainage, change in vision, nasal discharge, CVS- denies chest pain, palpitations RESP- denies SOB, cough, wheeze ABD- denies N/V, change in stools, abd pain GU- denies dysuria, hematuria, dribbling, incontinence MSK- denies joint pain, muscle aches, injury Neuro- denies headache, dizziness, syncope, seizure activity       Objective:    BP 126/76   Pulse 95   Temp 98.5 F (36.9 C)   Resp 18   Ht 5\' 3"  (1.6 m)   Wt 217 lb 9.6 oz (98.7 kg)   SpO2 97%   BMI 38.55 kg/m  GEN- NAD, alert and oriented x3 CVS- RRR, no murmur RESP-CTAB ABD-NABS,soft,NT,ND GEN- NAD, alert and oriented, GU- normal external genitalia, vaginal mucosa pink and moist, cervix visualized no growth, no blood form os, + discharge, no CMT, no ovarian masses, uterus normal size EXT- No edema Pulses- Radial, DP- 2+        Assessment & Plan:      Problem List Items Addressed This Visit    None    Visit Diagnoses    BV (bacterial vaginosis)    -  Primary   Treat with flagyl, given additional diflucan after she completes antibiotics, STD screen pending, Upreg negative    Relevant Medications   metroNIDAZOLE (FLAGYL) 500 MG tablet   Other Relevant Orders   WET PREP FOR [place], YEAST, CLUE (Completed)   C. trachomatis/N. gonorrhoeae RNA   Nausea and vomiting,  intractability of vomiting not specified, unspecified vomiting type       Discussed possible use of OCP now she has lost weight, she is going to think about this, GI upset in setting of bariatric surgery likely due to the piza   Relevant Orders   Pregnancy, urine (Completed)   Screen for STD (sexually transmitted disease)       Relevant Orders   HIV Antibody (routine testing w rflx)   RPR      Note: This dictation was prepared with Dragon dictation along with smaller phrase technology. Any transcriptional errors that result from this process are unintentional.

## 2018-12-06 ENCOUNTER — Encounter: Payer: Self-pay | Admitting: Family Medicine

## 2018-12-06 LAB — C. TRACHOMATIS/N. GONORRHOEAE RNA
C. trachomatis RNA, TMA: NOT DETECTED
N. gonorrhoeae RNA, TMA: NOT DETECTED

## 2019-02-08 ENCOUNTER — Encounter (HOSPITAL_COMMUNITY): Payer: Self-pay

## 2019-03-01 ENCOUNTER — Encounter: Payer: Self-pay | Admitting: Family Medicine

## 2019-03-01 ENCOUNTER — Other Ambulatory Visit: Payer: Self-pay

## 2019-03-01 ENCOUNTER — Ambulatory Visit (INDEPENDENT_AMBULATORY_CARE_PROVIDER_SITE_OTHER): Payer: No Typology Code available for payment source | Admitting: Family Medicine

## 2019-03-01 VITALS — BP 130/82 | HR 76 | Temp 98.7°F | Resp 18 | Ht 63.0 in | Wt 219.4 lb

## 2019-03-01 DIAGNOSIS — Z1239 Encounter for other screening for malignant neoplasm of breast: Secondary | ICD-10-CM

## 2019-03-01 DIAGNOSIS — Z9884 Bariatric surgery status: Secondary | ICD-10-CM | POA: Insufficient documentation

## 2019-03-01 DIAGNOSIS — Z0001 Encounter for general adult medical examination with abnormal findings: Secondary | ICD-10-CM

## 2019-03-01 DIAGNOSIS — Z113 Encounter for screening for infections with a predominantly sexual mode of transmission: Secondary | ICD-10-CM

## 2019-03-01 DIAGNOSIS — F5102 Adjustment insomnia: Secondary | ICD-10-CM

## 2019-03-01 DIAGNOSIS — Z6838 Body mass index (BMI) 38.0-38.9, adult: Secondary | ICD-10-CM

## 2019-03-01 DIAGNOSIS — Z23 Encounter for immunization: Secondary | ICD-10-CM

## 2019-03-01 DIAGNOSIS — Z8742 Personal history of other diseases of the female genital tract: Secondary | ICD-10-CM

## 2019-03-01 DIAGNOSIS — Z0000 Encounter for general adult medical examination without abnormal findings: Secondary | ICD-10-CM

## 2019-03-01 MED ORDER — CLOTRIMAZOLE-BETAMETHASONE 1-0.05 % EX CREA: 1.0000 "application " | TOPICAL_CREAM | Freq: Two times a day (BID) | CUTANEOUS | 2 refills | Status: DC

## 2019-03-01 NOTE — Progress Notes (Signed)
   Subjective:    Patient ID: Abigail Duran, female    DOB: 1975/07/08, 43 y.o.   MRN: [id number]  Patient presents for Annual Exam  Patient here for CPE.  Medications and history reviewed.  Her last Pap smear was in July 2019 which was normal. Immunizations she is due for flu shot tetanus booster is up-to-date Mammogram is due She is due for fasting labs.  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  She has been working on dietary changes sustaining weight loss since she had her gastric sleeve performed in 2019  Works at [employer], has had increased stress during covid,worired about infections at work, care of team members, family  not sleeping as well, decreased appetite, not taking supplements  Using melatonin 10mg  at bedtime past few days which has helped   Menses is fairly regulary, has history of PCOS   Followed by dentist   Review Of Systems:  GEN- denies fatigue, fever, weight loss,weakness, recent illness HEENT- denies eye drainage, change in vision, nasal discharge, CVS- denies chest pain, palpitations RESP- denies SOB, cough, wheeze ABD- denies N/V, change in stools, abd pain GU- denies dysuria, hematuria, dribbling, incontinence MSK- denies joint pain, muscle aches, injury Neuro- denies headache, dizziness, syncope, seizure activity       Objective:    BP 130/82 (BP Location: Right Arm, Patient Position: Sitting, Cuff Size: Normal)   Pulse 76   Temp 98.7 F (37.1 C) (Oral)   Resp 18   Ht 5\' 3"  (1.6 m)   Wt 219 lb 6.4 oz (99.5 kg)   SpO2 97%   BMI 38.86 kg/m  GEN- NAD, alert and oriented x3 HEENT- PERRL, EOMI, non injected sclera, pink conjunctiva, MMM, oropharynx clear, TM clear bilat, no effusion, nares clear  Neck- Supple, no thyromegaly CVS- RRR, no murmur RESP-CTAB ABD-NABS,soft,NT,ND Psych- normal affect and mood EXT- No edema Pulses- Radial, DP- 2+  Phq 9 score 5  - not sleeping well        Assessment & Plan:      Problem List Items  Addressed This Visit      Unprioritized   History of bariatric surgery   Relevant Orders   Hemoglobin A1c   Lipid panel   Vitamin B12   Vitamin D, 25-hydroxy   History of PCOS   Relevant Orders   Hemoglobin A1c   Obesity - Primary    Other Visit Diagnoses    Need for immunization against influenza       Relevant Orders   Flu Vaccine QUAD 36+ mos IM (Completed)   Routine general medical examination at a health care facility       CPE done, check labs, std screen at request, flu shot given, pt to schedule mammogram   Relevant Orders   CBC with Differential/Platelet   Comprehensive metabolic panel   Hemoglobin A1c   Lipid panel   Breast cancer screening       Screen for STD (sexually transmitted disease)       Relevant Orders   HIV Antibody (routine testing w rflx)   RPR   Insomnia due to stress       Continue melatonin 10mg  as needed, get back into routine of exercise, vitamisn, supplements      Note: This dictation was prepared with Dragon dictation along with smaller phrase technology. Any transcriptional errors that result from this process are unintentional.

## 2019-03-01 NOTE — Patient Instructions (Addendum)
F/U 1 year for Physical  Schedule your mammogram

## 2019-03-02 LAB — COMPREHENSIVE METABOLIC PANEL
AG Ratio: 1.4 (calc) (ref 1.0–2.5)
ALT: 10 U/L (ref 6–29)
AST: 13 U/L (ref 10–30)
Albumin: 3.7 g/dL (ref 3.6–5.1)
Alkaline phosphatase (APISO): 46 U/L (ref 31–125)
BUN: 18 mg/dL (ref 7–25)
CO2: 28 mmol/L (ref 20–32)
Calcium: 9.1 mg/dL (ref 8.6–10.2)
Chloride: 104 mmol/L (ref 98–110)
Creat: 0.87 mg/dL (ref 0.50–1.10)
Globulin: 2.7 g/dL (calc) (ref 1.9–3.7)
Glucose, Bld: 94 mg/dL (ref 65–99)
Potassium: 4.6 mmol/L (ref 3.5–5.3)
Sodium: 140 mmol/L (ref 135–146)
Total Bilirubin: 0.3 mg/dL (ref 0.2–1.2)
Total Protein: 6.4 g/dL (ref 6.1–8.1)

## 2019-03-02 LAB — CBC WITH DIFFERENTIAL/PLATELET
Absolute Monocytes: 329 cells/uL (ref 200–950)
Basophils Absolute: 41 cells/uL (ref 0–200)
Basophils Relative: 0.9 %
Eosinophils Absolute: 158 cells/uL (ref 15–500)
Eosinophils Relative: 3.5 %
HCT: 37 % (ref 35.0–45.0)
Hemoglobin: 12 g/dL (ref 11.7–15.5)
Lymphs Abs: 1962 cells/uL (ref 850–3900)
MCH: 28.3 pg (ref 27.0–33.0)
MCHC: 32.4 g/dL (ref 32.0–36.0)
MCV: 87.3 fL (ref 80.0–100.0)
MPV: 11.1 fL (ref 7.5–12.5)
Monocytes Relative: 7.3 %
Neutro Abs: 2012 cells/uL (ref 1500–7800)
Neutrophils Relative %: 44.7 %
Platelets: 221 10*3/uL (ref 140–400)
RBC: 4.24 10*6/uL (ref 3.80–5.10)
RDW: 13.7 % (ref 11.0–15.0)
Total Lymphocyte: 43.6 %
WBC: 4.5 10*3/uL (ref 3.8–10.8)

## 2019-03-02 LAB — VITAMIN D 25 HYDROXY (VIT D DEFICIENCY, FRACTURES): Vit D, 25-Hydroxy: 14 ng/mL — ABNORMAL LOW (ref 30–100)

## 2019-03-02 LAB — HIV ANTIBODY (ROUTINE TESTING W REFLEX): HIV 1&2 Ab, 4th Generation: NONREACTIVE

## 2019-03-02 LAB — HEMOGLOBIN A1C
Hgb A1c MFr Bld: 5.7 % of total Hgb — ABNORMAL HIGH (ref <5.7)
Mean Plasma Glucose: 117 (calc)
eAG (mmol/L): 6.5 (calc)

## 2019-03-02 LAB — RPR: RPR Ser Ql: NONREACTIVE

## 2019-03-02 LAB — LIPID PANEL
Cholesterol: 200 mg/dL — ABNORMAL HIGH (ref <200)
HDL: 65 mg/dL (ref >=50)
LDL Cholesterol (Calc): 118 mg/dL (calc) — ABNORMAL HIGH
Non-HDL Cholesterol (Calc): 135 mg/dL (calc) — ABNORMAL HIGH (ref <130)
Total CHOL/HDL Ratio: 3.1 (calc) (ref <5.0)
Triglycerides: 78 mg/dL (ref <150)

## 2019-03-02 LAB — VITAMIN B12: Vitamin B-12: 369 pg/mL (ref 200–1100)

## 2019-03-03 ENCOUNTER — Other Ambulatory Visit: Payer: Self-pay

## 2019-03-03 MED ORDER — VITAMIN D (ERGOCALCIFEROL) 1.25 MG (50000 UNIT) PO CAPS: 50000.0000 [IU] | ORAL_CAPSULE | ORAL | 0 refills | Status: DC

## 2019-04-04 ENCOUNTER — Encounter: Payer: Self-pay | Admitting: Family Medicine

## 2019-04-04 ENCOUNTER — Ambulatory Visit (INDEPENDENT_AMBULATORY_CARE_PROVIDER_SITE_OTHER): Payer: No Typology Code available for payment source | Admitting: Family Medicine

## 2019-04-04 ENCOUNTER — Other Ambulatory Visit: Payer: Self-pay

## 2019-04-04 VITALS — BP 130/72 | HR 90 | Temp 98.9°F | Resp 14 | Ht 63.0 in | Wt 218.0 lb

## 2019-04-04 DIAGNOSIS — M791 Myalgia, unspecified site: Secondary | ICD-10-CM | POA: Diagnosis not present

## 2019-04-04 DIAGNOSIS — F418 Other specified anxiety disorders: Secondary | ICD-10-CM | POA: Diagnosis not present

## 2019-04-04 DIAGNOSIS — T300 Burn of unspecified body region, unspecified degree: Secondary | ICD-10-CM | POA: Diagnosis not present

## 2019-04-04 MED ORDER — ALPRAZOLAM 0.5 MG PO TABS: 0.5000 mg | ORAL_TABLET | Freq: Two times a day (BID) | ORAL | 0 refills | Status: DC | PRN

## 2019-04-04 NOTE — Patient Instructions (Signed)
Call if not improved Try the xanax as needed
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
## 2019-05-01 ENCOUNTER — Other Ambulatory Visit: Payer: Self-pay

## 2019-05-01 ENCOUNTER — Encounter: Payer: Self-pay | Admitting: Family Medicine

## 2019-05-01 ENCOUNTER — Ambulatory Visit (INDEPENDENT_AMBULATORY_CARE_PROVIDER_SITE_OTHER): Payer: No Typology Code available for payment source | Admitting: Family Medicine

## 2019-05-01 VITALS — BP 118/68 | HR 89 | Temp 99.8°F | Resp 18 | Ht 63.0 in | Wt 217.0 lb

## 2019-05-01 DIAGNOSIS — N760 Acute vaginitis: Secondary | ICD-10-CM | POA: Diagnosis not present

## 2019-05-01 DIAGNOSIS — B9689 Other specified bacterial agents as the cause of diseases classified elsewhere: Secondary | ICD-10-CM

## 2019-05-01 DIAGNOSIS — R1030 Lower abdominal pain, unspecified: Secondary | ICD-10-CM | POA: Diagnosis not present

## 2019-05-01 DIAGNOSIS — Z3200 Encounter for pregnancy test, result unknown: Secondary | ICD-10-CM

## 2019-05-01 LAB — URINALYSIS, ROUTINE W REFLEX MICROSCOPIC
Bacteria, UA: NONE SEEN /HPF
Bilirubin Urine: NEGATIVE
Glucose, UA: NEGATIVE
Hyaline Cast: NONE SEEN /LPF
Ketones, ur: NEGATIVE
Leukocytes,Ua: NEGATIVE
Nitrite: NEGATIVE
Protein, ur: NEGATIVE
Specific Gravity, Urine: 1.025 (ref 1.001–1.03)
WBC, UA: NONE SEEN /HPF (ref 0–5)
pH: 6 (ref 5.0–8.0)

## 2019-05-01 LAB — MICROSCOPIC MESSAGE

## 2019-05-01 LAB — PREGNANCY, URINE: Preg Test, Ur: NEGATIVE

## 2019-05-01 LAB — WET PREP FOR TRICH, YEAST, CLUE

## 2019-05-01 MED ORDER — FLUCONAZOLE 150 MG PO TABS: 150.0000 mg | ORAL_TABLET | Freq: Once | ORAL | 0 refills | Status: AC

## 2019-05-01 MED ORDER — METRONIDAZOLE 500 MG PO TABS: 500.0000 mg | ORAL_TABLET | Freq: Two times a day (BID) | ORAL | 0 refills | Status: DC

## 2019-05-01 NOTE — Patient Instructions (Addendum)
F/U as needed

## 2019-05-01 NOTE — Progress Notes (Signed)
   Subjective:    Patient ID: Abigail Duran, female    DOB: 1975-10-17, 43 y.o.   MRN: [id number]  Patient presents for Vaginal Discharge (pt states having discharge)   Pt here with vaginal disharge started last Wed. Has some lower abdominal discomfort, not "pain". Felt like she urinated on herself, but instead was a moderate amount of smelly discharge. No blood streaked, no abnormal vaginal bleeding. Due for menses this Friday. No new partners. No change in bowel movements, no N/V, no fever Still occ has a discomfort in lower abdomen   Note she has only 2 doses of the xanax since our last visit   Pt requested pregnancy test   Review Of Systems:  GEN- denies fatigue, fever, weight loss,weakness, recent illness HEENT- denies eye drainage, change in vision, nasal discharge, CVS- denies chest pain, palpitations RESP- denies SOB, cough, wheeze ABD- denies N/V, change in stools, +abd pain GU- denies dysuria, hematuria, dribbling, incontinence MSK- denies joint pain, muscle aches, injury Neuro- denies headache, dizziness, syncope, seizure activity       Objective:    BP 118/68 (BP Location: Right Arm, Patient Position: Sitting, Cuff Size: Large)   Pulse 89   Temp 99.8 F (37.7 C) (Oral)   Resp 18   Ht 5\' 3"  (1.6 m)   Wt 217 lb (98.4 kg)   SpO2 99%   BMI 38.44 kg/m  GEN- NAD, alert and oriented x3 CVS- RRR, no murmur RESP-CTAB ABD-NABS,soft,NT,ND GU- normal external genitalia, vaginal mucosa pink and moist, cervix visualized no growth, no blood form os, + white discharge, no CMT, no ovarian masses, uterus normal size EXT- No edema Pulses- Radial  2+        Assessment & Plan:    Note 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  She is following up with her psychiatrist that she was working with during her bariatric weight loss surgery.  For now she will continue with her alprazolam as needed, she has appt with psychiatry in 2 weeks   Problem List Items Addressed This Visit    None    Visit Diagnoses    BV (bacterial vaginosis)    -  Primary   TreaT WITH FLAGYL, given diflucan in case of yeast after antibiotics   Relevant Medications   metroNIDAZOLE (FLAGYL) 500 MG tablet   fluconazole (DIFLUCAN) 150 MG tablet   Other Relevant Orders   WET PREP FOR [place], YEAST, CLUE (Completed)   C. trachomatis/N. gonorrhoeae RNA   Lower abdominal pain       UA neg, U preg negative    Relevant Orders   Urinalysis, Routine w reflex microscopic (Completed)   Encounter for pregnancy test, result unknown       Relevant Orders   Pregnancy, urine (Completed)      Note: This dictation was prepared with Dragon dictation along with smaller phrase technology. Any transcriptional errors that result from this process are unintentional.

## 2019-05-02 LAB — C. TRACHOMATIS/N. GONORRHOEAE RNA
C. trachomatis RNA, TMA: NOT DETECTED
N. gonorrhoeae RNA, TMA: NOT DETECTED

## 2019-12-26 ENCOUNTER — Encounter (HOSPITAL_COMMUNITY): Payer: Self-pay

## 2020-01-12 ENCOUNTER — Other Ambulatory Visit: Payer: Self-pay | Admitting: Family Medicine

## 2020-01-15 MED ORDER — VITAMIN D (CHOLECALCIFEROL) 50 MCG (2000 UT) PO CAPS: 1.0000 | ORAL_CAPSULE | Freq: Every day | ORAL | 11 refills | Status: DC

## 2020-01-24 ENCOUNTER — Encounter: Payer: No Typology Code available for payment source | Admitting: Family Medicine

## 2020-01-24 ENCOUNTER — Encounter: Payer: Self-pay | Admitting: Family Medicine

## 2020-01-24 ENCOUNTER — Ambulatory Visit (INDEPENDENT_AMBULATORY_CARE_PROVIDER_SITE_OTHER): Payer: No Typology Code available for payment source | Admitting: Family Medicine

## 2020-01-24 ENCOUNTER — Other Ambulatory Visit: Payer: Self-pay

## 2020-01-24 VITALS — BP 130/72 | HR 72 | Temp 98.6°F | Resp 14 | Ht 63.0 in | Wt 218.0 lb

## 2020-01-24 DIAGNOSIS — Z9884 Bariatric surgery status: Secondary | ICD-10-CM

## 2020-01-24 DIAGNOSIS — R5383 Other fatigue: Secondary | ICD-10-CM

## 2020-01-24 DIAGNOSIS — F418 Other specified anxiety disorders: Secondary | ICD-10-CM | POA: Diagnosis not present

## 2020-01-24 DIAGNOSIS — E559 Vitamin D deficiency, unspecified: Secondary | ICD-10-CM

## 2020-01-24 DIAGNOSIS — Z6838 Body mass index (BMI) 38.0-38.9, adult: Secondary | ICD-10-CM

## 2020-01-24 DIAGNOSIS — E538 Deficiency of other specified B group vitamins: Secondary | ICD-10-CM

## 2020-01-24 MED ORDER — ESCITALOPRAM OXALATE 5 MG PO TABS: 5.0000 mg | ORAL_TABLET | Freq: Every day | ORAL | 1 refills | Status: DC

## 2020-01-24 NOTE — Progress Notes (Signed)
   Subjective:    Patient ID: Abigail Duran, female    DOB: Jul 16, 1975, 44 y.o.   MRN: [id number]  Patient presents for Follow-up (is fasting) and Anxiety/ Depression (increased stress since having COVID)   Pt here with increased stress. Feels her mind racing at night, not sleeping as well, feels on edge all the time, other times just depressed and tearful.  Symptoms worse since COVID diagnosis during the winter  She has had multiple friends and family members die in the past few months, including a close friend that commited suicide  She has stress at work and staffing issues    Incresead fatigue, she has had gastic sleeve   she has been taking OTC B12   she is not consistent with her MVI , she has been taking biotin and vitamin E due to hair shedding        Review Of Systems:  GEN- +fatigue, fever, weight loss,weakness, recent illness HEENT- denies eye drainage, change in vision, nasal discharge, CVS- denies chest pain, palpitations RESP- denies SOB, cough, wheeze ABD- denies N/V, change in stools, abd pain GU- denies dysuria, hematuria, dribbling, incontinence MSK- denies joint pain, muscle aches, injury Neuro- denies headache, dizziness, syncope, seizure activity       Objective:    BP (!) 130/72   Pulse 72   Temp 98.6 F (37 C) (Temporal)   Resp 14   Ht 5\' 3"  (1.6 m)   Wt (!) 218 lb (98.9 kg)   SpO2 99%   BMI 38.62 kg/m  GEN- NAD, alert and oriented x3 HEENT- PERRL, EOMI, non injected sclera, pink conjunctiva, MMM, oropharynx clear Neck- Supple, no thyromegaly CVS- RRR, no murmur RESP-CTAB Psych- stressed appearing, well groomed, good eye contact, no SI, PHQ 9 score 10  GAD7 14 EXT- No edema Pulses- Radial 2+  GAD 7 score 14 PHQ 9 score 10      Assessment & Plan:      Problem List Items Addressed This Visit      Unprioritized   History of bariatric surgery    Fatigue may be MTF but also not on supplements Check labs and recommend appropriate  vitamin replacement  For depression with anxiety - start lexapro at bedtime, she is sensitive to meds so will try low dose 5mg   F/u in a few weeks  No SI      Relevant Orders   Iron, TIBC and Ferritin Panel   Hemoglobin A1c   Obesity   Relevant Orders   Lipid panel   Hemoglobin A1c    Other Visit Diagnoses    Depression with anxiety    -  Primary   Relevant Medications   escitalopram (LEXAPRO) 5 MG tablet   Vitamin D deficiency       Relevant Orders   Vitamin D, 25-hydroxy   B12 deficiency       Relevant Orders   Iron, TIBC and Ferritin Panel   Vitamin B12   Other fatigue       Relevant Orders   TSH   Iron, TIBC and Ferritin Panel   CBC with Differential/Platelet (Completed)   Comprehensive metabolic panel      Note: This dictation was prepared with Dragon dictation along with smaller phrase technology. Any transcriptional errors that result from this process are unintentional.

## 2020-01-24 NOTE — Assessment & Plan Note (Signed)
Fatigue may be MTF but also not on supplements Check labs and recommend appropriate vitamin replacement  For depression with anxiety - start lexapro at bedtime, she is sensitive to meds so will try low dose 5mg   F/u in a few weeks  No SI

## 2020-01-24 NOTE — Patient Instructions (Signed)
We will call with results  F/U 3-4 WEEKS for medication

## 2020-01-25 LAB — LIPID PANEL
Cholesterol: 221 mg/dL — ABNORMAL HIGH (ref <200)
HDL: 62 mg/dL (ref >=50)
LDL Cholesterol (Calc): 140 mg/dL (calc) — ABNORMAL HIGH
Non-HDL Cholesterol (Calc): 159 mg/dL (calc) — ABNORMAL HIGH (ref <130)
Total CHOL/HDL Ratio: 3.6 (calc) (ref <5.0)
Triglycerides: 88 mg/dL (ref <150)

## 2020-01-25 LAB — COMPREHENSIVE METABOLIC PANEL
AG Ratio: 1.3 (calc) (ref 1.0–2.5)
ALT: 8 U/L (ref 6–29)
AST: 14 U/L (ref 10–30)
Albumin: 3.9 g/dL (ref 3.6–5.1)
Alkaline phosphatase (APISO): 59 U/L (ref 31–125)
BUN: 15 mg/dL (ref 7–25)
CO2: 27 mmol/L (ref 20–32)
Calcium: 9.2 mg/dL (ref 8.6–10.2)
Chloride: 102 mmol/L (ref 98–110)
Creat: 0.82 mg/dL (ref 0.50–1.10)
Globulin: 3 g/dL (calc) (ref 1.9–3.7)
Glucose, Bld: 82 mg/dL (ref 65–99)
Potassium: 4.4 mmol/L (ref 3.5–5.3)
Sodium: 139 mmol/L (ref 135–146)
Total Bilirubin: 0.4 mg/dL (ref 0.2–1.2)
Total Protein: 6.9 g/dL (ref 6.1–8.1)

## 2020-01-25 LAB — CBC WITH DIFFERENTIAL/PLATELET
Absolute Monocytes: 361 cells/uL (ref 200–950)
Basophils Absolute: 40 cells/uL (ref 0–200)
Basophils Relative: 0.9 %
Eosinophils Absolute: 101 cells/uL (ref 15–500)
Eosinophils Relative: 2.3 %
HCT: 36.9 % (ref 35.0–45.0)
Hemoglobin: 12 g/dL (ref 11.7–15.5)
Lymphs Abs: 1883 cells/uL (ref 850–3900)
MCH: 28 pg (ref 27.0–33.0)
MCHC: 32.5 g/dL (ref 32.0–36.0)
MCV: 86 fL (ref 80.0–100.0)
MPV: 10.9 fL (ref 7.5–12.5)
Monocytes Relative: 8.2 %
Neutro Abs: 2015 cells/uL (ref 1500–7800)
Neutrophils Relative %: 45.8 %
Platelets: 246 10*3/uL (ref 140–400)
RBC: 4.29 10*6/uL (ref 3.80–5.10)
RDW: 13.4 % (ref 11.0–15.0)
Total Lymphocyte: 42.8 %
WBC: 4.4 10*3/uL (ref 3.8–10.8)

## 2020-01-25 LAB — IRON,TIBC AND FERRITIN PANEL
Ferritin: 58 ng/mL (ref 16–232)
Iron: 86 ug/dL (ref 40–190)

## 2020-01-25 LAB — HEMOGLOBIN A1C
Hgb A1c MFr Bld: 5.5 % of total Hgb (ref <5.7)
Mean Plasma Glucose: 111 (calc)
eAG (mmol/L): 6.2 (calc)

## 2020-01-25 LAB — TSH: TSH: 1.94 mIU/L

## 2020-01-25 LAB — VITAMIN D 25 HYDROXY (VIT D DEFICIENCY, FRACTURES): Vit D, 25-Hydroxy: 40 ng/mL (ref 30–100)

## 2020-01-25 LAB — VITAMIN B12: Vitamin B-12: >2000 pg/mL — ABNORMAL HIGH (ref 200–1100)

## 2020-02-15 ENCOUNTER — Other Ambulatory Visit: Payer: Self-pay | Admitting: Family Medicine

## 2020-03-13 ENCOUNTER — Ambulatory Visit: Payer: No Typology Code available for payment source | Admitting: Family Medicine

## 2020-04-09 ENCOUNTER — Other Ambulatory Visit: Payer: Self-pay

## 2020-04-09 ENCOUNTER — Encounter: Payer: Self-pay | Admitting: Family Medicine

## 2020-04-09 ENCOUNTER — Ambulatory Visit (INDEPENDENT_AMBULATORY_CARE_PROVIDER_SITE_OTHER): Payer: No Typology Code available for payment source | Admitting: Family Medicine

## 2020-04-09 VITALS — BP 128/84 | HR 90 | Temp 97.9°F | Resp 14 | Ht 63.0 in | Wt 223.0 lb

## 2020-04-09 DIAGNOSIS — E785 Hyperlipidemia, unspecified: Secondary | ICD-10-CM

## 2020-04-09 DIAGNOSIS — Z6839 Body mass index (BMI) 39.0-39.9, adult: Secondary | ICD-10-CM

## 2020-04-09 DIAGNOSIS — F418 Other specified anxiety disorders: Secondary | ICD-10-CM | POA: Diagnosis not present

## 2020-04-09 DIAGNOSIS — Z9884 Bariatric surgery status: Secondary | ICD-10-CM

## 2020-04-09 DIAGNOSIS — Z8742 Personal history of other diseases of the female genital tract: Secondary | ICD-10-CM | POA: Diagnosis not present

## 2020-04-09 DIAGNOSIS — R635 Abnormal weight gain: Secondary | ICD-10-CM

## 2020-04-09 MED ORDER — ESCITALOPRAM OXALATE 10 MG PO TABS
ORAL_TABLET | ORAL | 1 refills | Status: DC
Start: 2020-04-09 — End: 2021-01-30

## 2020-04-09 MED ORDER — ALPRAZOLAM 0.5 MG PO TABS: 0.5000 mg | ORAL_TABLET | Freq: Two times a day (BID) | ORAL | 0 refills | Status: DC | PRN

## 2020-04-09 NOTE — Assessment & Plan Note (Signed)
Weight loss surgery with increasing weight.  Concentrate on protein.  Continue the supplements.  We will plan to start weight loss medication hopefully GLP-1 therapy with insurance.  Consider using this if he cannot get GLP-1 therapy covered.  She does have some underlying depression and anxiety therefore we can use a low dose of phentermine so we do not exacerbate this.

## 2020-04-09 NOTE — Assessment & Plan Note (Signed)
Increase Lexapro to 10 mg at bedtime very rare use of alprazolam.  She is also exercising which helps with her stress levels.

## 2020-04-09 NOTE — Patient Instructions (Addendum)
If Bernie Covey is started Inject 0.6mg  daily x 2 weeks, then go to 1.2mg  daily If Ozempic is started inject 0.25mg  once a week We will call with results  Lexapro increased to 10mg  at bedtime F/U 65month

## 2020-04-09 NOTE — Progress Notes (Signed)
Subjective:    Patient ID: Abigail Duran, female    DOB: 1975-11-21, 44 y.o.   MRN: [id number]  Patient presents for Follow-up (is fasting)  Patient here follow-up chronic medical problems.  Medications reviewed.  She was last seen in [place], and started on Lexapro 5mg  along.  xanax 0.5mg  prn she missed her F/U visit due to work She admits she has a to a lot of stressors  Lexapro to help with her sleep and helps her rest at night but during the day she still feels very anxious and there is a lot on her plate.  She prefers not to use the alprazolam unless absolutely necessary.  She is on vitamin D daily/ she is MVI daily She has been stressed because she continues to gain weight. Her bariatric surgery was 3 years ago She has gained 5 pounds since her last visit in July.  She is concerned she thinking back up toward her starting weight before her surgery.  She does have some meals because she is very busy at work but she tries to get her protein.  Also walking for exercise.    Review Of Systems:  GEN- denies fatigue, fever, weight loss,weakness, recent illness HEENT- denies eye drainage, change in vision, nasal discharge, CVS- denies chest pain, palpitations RESP- denies SOB, cough, wheeze ABD- denies N/V, change in stools, abd pain GU- denies dysuria, hematuria, dribbling, incontinence MSK- denies joint pain, muscle aches, injury Neuro- denies headache, dizziness, syncope, seizure activity       Objective:    BP 128/84   Pulse 90   Temp 97.9 F (36.6 C) (Temporal)   Resp 14   Ht 5\' 3"  (1.6 m)   Wt 223 lb (101.2 kg)   LMP 03/13/2020 Comment: regular  SpO2 100%   BMI 39.50 kg/m  GEN- NAD, alert and oriented x3 HEENT- PERRL, EOMI, non injected sclera, pink conjunctiva, MMM, oropharynx clear Neck- Supple, no thyromegaly CVS- RRR, no murmur RESP-CTAB ABD-NABS,soft,NT,ND Psych normal affect and mood ,PHQ 9 score 7  EXT- No edema Pulses- Radial, DP- 2+         Assessment & Plan:      Problem List Items Addressed This Visit      Unprioritized   Depression with anxiety    Increase Lexapro to 10 mg at bedtime very rare use of alprazolam.  She is also exercising which helps with her stress levels.      Relevant Medications   escitalopram (LEXAPRO) 10 MG tablet   ALPRAZolam (XANAX) 0.5 MG tablet   History of bariatric surgery    Weight loss surgery with increasing weight.  Concentrate on protein.  Continue the supplements.  We will plan to start weight loss medication hopefully GLP-1 therapy with insurance.  Consider using this if he cannot get GLP-1 therapy covered.  She does have some underlying depression and anxiety therefore we can use a low dose of phentermine so we do not exacerbate this.      History of PCOS - Primary   Obesity   Relevant Orders   Comprehensive metabolic panel   CBC with Differential/Platelet   Hemoglobin A1c    Other Visit Diagnoses    Mild hyperlipidemia       Relevant Orders   Lipid panel   Weight gain          Note: This dictation was prepared with Dragon dictation along with smaller phrase technology. Any transcriptional errors that result from this process are unintentional.

## 2020-04-10 ENCOUNTER — Encounter: Payer: Self-pay | Admitting: Family Medicine

## 2020-04-10 DIAGNOSIS — R7303 Prediabetes: Secondary | ICD-10-CM | POA: Insufficient documentation

## 2020-04-10 LAB — CBC WITH DIFFERENTIAL/PLATELET
Absolute Monocytes: 377 cells/uL (ref 200–950)
Basophils Absolute: 32 cells/uL (ref 0–200)
Basophils Relative: 0.7 %
Eosinophils Absolute: 92 cells/uL (ref 15–500)
Eosinophils Relative: 2 %
HCT: 37.8 % (ref 35.0–45.0)
Hemoglobin: 12.3 g/dL (ref 11.7–15.5)
Lymphs Abs: 1628 cells/uL (ref 850–3900)
MCH: 28 pg (ref 27.0–33.0)
MCHC: 32.5 g/dL (ref 32.0–36.0)
MCV: 85.9 fL (ref 80.0–100.0)
MPV: 10.9 fL (ref 7.5–12.5)
Monocytes Relative: 8.2 %
Neutro Abs: 2470 cells/uL (ref 1500–7800)
Neutrophils Relative %: 53.7 %
Platelets: 243 10*3/uL (ref 140–400)
RBC: 4.4 10*6/uL (ref 3.80–5.10)
RDW: 14.1 % (ref 11.0–15.0)
Total Lymphocyte: 35.4 %
WBC: 4.6 10*3/uL (ref 3.8–10.8)

## 2020-04-10 LAB — HEMOGLOBIN A1C
Hgb A1c MFr Bld: 5.9 % of total Hgb — ABNORMAL HIGH (ref <5.7)
Mean Plasma Glucose: 123 (calc)
eAG (mmol/L): 6.8 (calc)

## 2020-04-10 LAB — LIPID PANEL
Cholesterol: 208 mg/dL — ABNORMAL HIGH (ref <200)
HDL: 65 mg/dL (ref >=50)
LDL Cholesterol (Calc): 123 mg/dL (calc) — ABNORMAL HIGH
Non-HDL Cholesterol (Calc): 143 mg/dL (calc) — ABNORMAL HIGH (ref <130)
Total CHOL/HDL Ratio: 3.2 (calc) (ref <5.0)
Triglycerides: 96 mg/dL (ref <150)

## 2020-04-10 LAB — COMPREHENSIVE METABOLIC PANEL
AG Ratio: 1.3 (calc) (ref 1.0–2.5)
ALT: 8 U/L (ref 6–29)
AST: 12 U/L (ref 10–30)
Albumin: 3.9 g/dL (ref 3.6–5.1)
Alkaline phosphatase (APISO): 55 U/L (ref 31–125)
BUN: 15 mg/dL (ref 7–25)
CO2: 28 mmol/L (ref 20–32)
Calcium: 9.1 mg/dL (ref 8.6–10.2)
Chloride: 103 mmol/L (ref 98–110)
Creat: 0.78 mg/dL (ref 0.50–1.10)
Globulin: 3.1 g/dL (calc) (ref 1.9–3.7)
Glucose, Bld: 100 mg/dL — ABNORMAL HIGH (ref 65–99)
Potassium: 4.5 mmol/L (ref 3.5–5.3)
Sodium: 140 mmol/L (ref 135–146)
Total Bilirubin: 0.4 mg/dL (ref 0.2–1.2)
Total Protein: 7 g/dL (ref 6.1–8.1)

## 2020-04-15 ENCOUNTER — Other Ambulatory Visit: Payer: Self-pay | Admitting: *Deleted

## 2020-04-15 MED ORDER — OZEMPIC (0.25 OR 0.5 MG/DOSE) 2 MG/1.5ML ~~LOC~~ SOPN: 0.2500 mg | PEN_INJECTOR | SUBCUTANEOUS | 3 refills | Status: DC

## 2020-05-09 ENCOUNTER — Telehealth: Payer: Self-pay | Admitting: *Deleted

## 2020-05-09 MED ORDER — ONDANSETRON 4 MG PO TBDP: 4.0000 mg | ORAL_TABLET | Freq: Three times a day (TID) | ORAL | 0 refills | Status: DC | PRN

## 2020-05-09 NOTE — Telephone Encounter (Signed)
Stop the ozempic Okay to send zofran 4mg  every 8 hours prn If she has UTI symptoms agree needs OV, if we dont have anything available  AND PT  is stable does not have fever, chills, severe abd pain        She can leave UA/Urine culture

## 2020-05-09 NOTE — Telephone Encounter (Signed)
Received call from patient.   Reports that she is on week #3 of Ozempic. States that she has had increased nausea this week. Reports that she has tried to eat smaller amounts more frequently, but this has not helped. Reports that she is nauseous almost all day long.   Advised not to take injection on Monday as scheduled. Inquired as to what she can do now until medication is out of her system.   MD please advise.   Also reports increased flank pain and urinary urgency. Appointment scheduled to evaluate for UTI.

## 2020-05-09 NOTE — Addendum Note (Signed)
Addended by: Phillips Odor on: 05/09/2020 10:54 AM   Modules accepted: Orders

## 2020-05-09 NOTE — Telephone Encounter (Signed)
Call placed to patient and patient made aware per Vm.   Prescription sent to pharmacy.

## 2020-05-10 ENCOUNTER — Other Ambulatory Visit: Payer: Self-pay

## 2020-05-10 ENCOUNTER — Encounter: Payer: Self-pay | Admitting: Family Medicine

## 2020-05-10 ENCOUNTER — Ambulatory Visit (INDEPENDENT_AMBULATORY_CARE_PROVIDER_SITE_OTHER): Payer: No Typology Code available for payment source | Admitting: Family Medicine

## 2020-05-10 VITALS — BP 150/100 | HR 114 | Temp 99.6°F | Ht 63.0 in | Wt 213.0 lb

## 2020-05-10 DIAGNOSIS — R5383 Other fatigue: Secondary | ICD-10-CM

## 2020-05-10 DIAGNOSIS — R350 Frequency of micturition: Secondary | ICD-10-CM | POA: Diagnosis not present

## 2020-05-10 DIAGNOSIS — M545 Low back pain, unspecified: Secondary | ICD-10-CM | POA: Diagnosis not present

## 2020-05-10 DIAGNOSIS — R000 Tachycardia, unspecified: Secondary | ICD-10-CM | POA: Diagnosis not present

## 2020-05-10 DIAGNOSIS — G8929 Other chronic pain: Secondary | ICD-10-CM

## 2020-05-10 LAB — URINALYSIS, ROUTINE W REFLEX MICROSCOPIC
Bilirubin Urine: NEGATIVE
Glucose, UA: NEGATIVE
Hyaline Cast: NONE SEEN /LPF
Nitrite: NEGATIVE
RBC / HPF: >=60 /HPF — AB (ref 0–2)
Specific Gravity, Urine: 1.028 (ref 1.001–1.03)
pH: 6 (ref 5.0–8.0)

## 2020-05-10 LAB — MICROSCOPIC MESSAGE

## 2020-05-10 MED ORDER — SULFAMETHOXAZOLE-TRIMETHOPRIM 800-160 MG PO TABS: 1.0000 | ORAL_TABLET | Freq: Two times a day (BID) | ORAL | 0 refills | Status: DC

## 2020-05-10 NOTE — Progress Notes (Signed)
Subjective:    Patient ID: Abigail Duran, female    DOB: 27-Jul-1975, 44 y.o.   MRN: [id number]  HPI Patient is a very pleasant 44 year old African-American female who presents today feeling poorly.  She states that she had Covid in December of last year.  She had her second Covid shot this summer.  1 week ago she had her third Covid shot.  Ever since she had the Covid shot she reports headaches, body aches, fatigue, nausea.  She is tachycardic today at 116 bpm.  She has a low-grade fever at 99.6.  I do not think she has Covid as she is already had the virus and multiple vaccinations.  She denies any sore throat.  She denies any cough.  On Monday she took her third Ozempic shot.  This only exacerbated the nausea.  Now she reports nausea and vomiting and just feeling weak and sick.  She reports increased urinary frequency.  She is on her period.  Therefore her urinalysis is difficult to interpret.  There is blood in her urinalysis as one would expect.  However there is also trace leukocyte esterase but she is nitrite negative.  Past Medical History:  Diagnosis Date  . CIN I (cervical intraepithelial neoplasia I) 07/30/2010  . Headache   . History of PCOS 2008  . History of varicella   . Irregular periods/menstrual cycles 12/14/06  . LGSIL (low grade squamous intraepithelial lesion) on Pap smear 08/13/09   colpo  . Obesity   . Pre-diabetes   . Wears glasses   . Yeast infection    hx/o   Past Surgical History:  Procedure Laterality Date  . COLPOSCOPY  2010  . LAPAROSCOPIC GASTRIC SLEEVE RESECTION N/A 05/31/2017   Procedure: LAPAROSCOPIC GASTRIC SLEEVE RESECTION, UPPER ENDOSCOPY;  Surgeon: Sheliah Hatch, De Blanch, MD;  Location: [facility];  Service: General;  Laterality: N/A;   Current Outpatient Medications on File Prior to Visit  Medication Sig Dispense Refill  . acetaminophen (TYLENOL) 500 MG tablet Take 1,000 mg by mouth every 6 (six) hours as needed for moderate pain or headache.    .  ALPRAZolam (XANAX) 0.5 MG tablet Take 1 tablet (0.5 mg total) by mouth 2 (two) times daily as needed for anxiety. 30 tablet 0  . cholecalciferol (VITAMIN D3) 25 MCG (1000 UNIT) tablet Take 1,000 Units by mouth daily.    . clotrimazole-betamethasone (LOTRISONE) cream Apply 1 application topically 2 (two) times daily. (Patient not taking: Reported on 04/09/2020) 30 g 2  . escitalopram (LEXAPRO) 10 MG tablet TAKE 1 TABLET BY MOUTH EVERYDAY AT BEDTIME 90 tablet 1  . ondansetron (ZOFRAN ODT) 4 MG disintegrating tablet Take 1 tablet (4 mg total) by mouth every 8 (eight) hours as needed for nausea or vomiting. 20 tablet 0  . OVER THE COUNTER MEDICATION Take 1 tablet by mouth 3 (three) times daily. Bariatric Advantage Chewable Multi-DA Mixed Fruit Supplement     No current facility-administered medications on file prior to visit.   Allergies  Allergen Reactions  . Codeine Nausea And Vomiting   Social History   Socioeconomic History  . Marital status: Divorced    Spouse name: Not on file  . Number of children: Not on file  . Years of education: Not on file  . Highest education level: Not on file  Occupational History  . Not on file  Tobacco Use  . Smoking status: Never Smoker  . Smokeless tobacco: Never Used  Vaping Use  . Vaping Use: Never used  Substance and Sexual Activity  . Alcohol use: No    Comment: occasionally   . Drug use: No  . Sexual activity: Yes  Other Topics Concern  . 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  Exercise - walking and weight loss challenge at work.  Hip hop dance.  Usually walks [facility].  No children.  No current significant other.  As of 08/2014.   Social Determinants of Health   Financial Resource Strain:   . Difficulty of Paying Living Expenses: Not on file  Food Insecurity:   . Worried About Programme researcher, broadcasting/film/video in the Last Year: Not on file  . Ran Out of Food in the Last Year: Not on  file  Transportation Needs:   . Lack of Transportation (Medical): Not on file  . Lack of Transportation (Non-Medical): Not on file  Physical Activity:   . Days of Exercise per Week: Not on file  . Minutes of Exercise per Session: Not on file  Stress:   . Feeling of Stress : Not on file  Social Connections:   . Frequency of Communication with Friends and Family: Not on file  . Frequency of Social Gatherings with Friends and Family: Not on file  . Attends Religious Services: Not on file  . Active Member of Clubs or Organizations: Not on file  . Attends Banker Meetings: Not on file  . Marital Status: Not on file  Intimate Partner Violence:   . Fear of Current or Ex-Partner: Not on file  . Emotionally Abused: Not on file  . Physically Abused: Not on file  . Sexually Abused: Not on file     Review of Systems  All other systems reviewed and are negative.      Objective:   Physical Exam Vitals reviewed.  Constitutional:      General: She is not in acute distress.    Appearance: Normal appearance. She is obese. She is not ill-appearing or toxic-appearing.  HENT:     Right Ear: Tympanic membrane and ear canal normal.     Left Ear: Tympanic membrane and ear canal normal.     Nose: Nose normal.     Mouth/Throat:     Mouth: Mucous membranes are dry.  Eyes:     Conjunctiva/sclera: Conjunctivae normal.  Cardiovascular:     Rate and Rhythm: Regular rhythm. Tachycardia present.     Heart sounds: Normal heart sounds. No murmur heard.  No friction rub. No gallop.   Pulmonary:     Effort: Pulmonary effort is normal. No respiratory distress.     Breath sounds: Normal breath sounds. No stridor. No wheezing, rhonchi or rales.  Chest:     Chest wall: No tenderness.  Abdominal:     General: Abdomen is flat. Bowel sounds are normal. There is no distension.     Palpations: Abdomen is soft.     Tenderness: There is no abdominal tenderness. There is no guarding or rebound.    Musculoskeletal:     Cervical back: Neck supple.  Lymphadenopathy:     Cervical: No cervical adenopathy.  Neurological:     Mental Status: She is alert.           Assessment & Plan:  Frequent urination - Plan: Urinalysis, Routine w reflex microscopic, Urine Culture  Other fatigue - Plan: Urinalysis, Routine w reflex microscopic, Urine Culture  Chronic low back pain, unspecified back pain laterality,  unspecified whether sciatica present - Plan: Urinalysis, Routine w reflex microscopic, Urine Culture  Heart rate fast - Plan: CBC with Differential/Platelet, COMPLETE METABOLIC PANEL WITH GFR, TSH  Exam today is significant for tachycardia, hypertension, a low-grade fever, and what appears to be dehydration.  I believe it is a perfect storm.  I believe that the nausea is likely caused by Ozempic.  Also believe that she had an aggressive response to her third dose of the Covid vaccine primarily due to having had Covid as well.  Therefore I believe that a lot of the body aches and fatigue could be due to this.  Also believe that she may have a urinary tract infection and be dehydrated.  Therefore I will treat the urinary tract infection with Bactrim double strength tablets twice daily for 5 days.  I encouraged the patient to push fluids such as Gatorade to treat the dehydration.  I recommended that she hold the Ozempic.  I recommended that she use Zofran 3 times a day on a scheduled basis.  I will check CBC, CMP, and TSH.  Reassess next week or sooner if worsening

## 2020-05-11 LAB — CBC WITH DIFFERENTIAL/PLATELET
Absolute Monocytes: 322 cells/uL (ref 200–950)
Basophils Absolute: 29 cells/uL (ref 0–200)
Basophils Relative: 0.6 %
Eosinophils Absolute: 10 cells/uL — ABNORMAL LOW (ref 15–500)
Eosinophils Relative: 0.2 %
HCT: 39.3 % (ref 35.0–45.0)
Hemoglobin: 12.7 g/dL (ref 11.7–15.5)
Lymphs Abs: 1762 cells/uL (ref 850–3900)
MCH: 27.8 pg (ref 27.0–33.0)
MCHC: 32.3 g/dL (ref 32.0–36.0)
MCV: 86 fL (ref 80.0–100.0)
MPV: 10.5 fL (ref 7.5–12.5)
Monocytes Relative: 6.7 %
Neutro Abs: 2678 cells/uL (ref 1500–7800)
Neutrophils Relative %: 55.8 %
Platelets: 192 10*3/uL (ref 140–400)
RBC: 4.57 10*6/uL (ref 3.80–5.10)
RDW: 13.8 % (ref 11.0–15.0)
Total Lymphocyte: 36.7 %
WBC: 4.8 10*3/uL (ref 3.8–10.8)

## 2020-05-11 LAB — URINE CULTURE
MICRO NUMBER:: 11196977
SPECIMEN QUALITY:: ADEQUATE

## 2020-05-11 LAB — COMPLETE METABOLIC PANEL WITH GFR
AG Ratio: 1.3 (calc) (ref 1.0–2.5)
ALT: 10 U/L (ref 6–29)
AST: 17 U/L (ref 10–30)
Albumin: 3.9 g/dL (ref 3.6–5.1)
Alkaline phosphatase (APISO): 62 U/L (ref 31–125)
BUN: 13 mg/dL (ref 7–25)
CO2: 26 mmol/L (ref 20–32)
Calcium: 8.7 mg/dL (ref 8.6–10.2)
Chloride: 102 mmol/L (ref 98–110)
Creat: 0.89 mg/dL (ref 0.50–1.10)
GFR, Est African American: 93 mL/min/{1.73_m2} (ref >=60)
GFR, Est Non African American: 80 mL/min/{1.73_m2} (ref >=60)
Globulin: 3 g/dL (calc) (ref 1.9–3.7)
Glucose, Bld: 83 mg/dL (ref 65–99)
Potassium: 4 mmol/L (ref 3.5–5.3)
Sodium: 139 mmol/L (ref 135–146)
Total Bilirubin: 0.2 mg/dL (ref 0.2–1.2)
Total Protein: 6.9 g/dL (ref 6.1–8.1)

## 2020-05-11 LAB — TSH: TSH: 2.12 mIU/L

## 2020-05-13 ENCOUNTER — Other Ambulatory Visit: Payer: Self-pay | Admitting: *Deleted

## 2020-05-13 MED ORDER — FLUCONAZOLE 150 MG PO TABS: 150.0000 mg | ORAL_TABLET | Freq: Once | ORAL | 0 refills | Status: AC

## 2020-07-03 ENCOUNTER — Ambulatory Visit (INDEPENDENT_AMBULATORY_CARE_PROVIDER_SITE_OTHER): Payer: No Typology Code available for payment source | Admitting: Family Medicine

## 2020-07-03 ENCOUNTER — Encounter: Payer: Self-pay | Admitting: Family Medicine

## 2020-07-03 ENCOUNTER — Other Ambulatory Visit: Payer: Self-pay

## 2020-07-03 VITALS — BP 124/70 | HR 80 | Temp 98.2°F | Resp 14 | Ht 63.0 in | Wt 220.0 lb

## 2020-07-03 DIAGNOSIS — J989 Respiratory disorder, unspecified: Secondary | ICD-10-CM

## 2020-07-03 DIAGNOSIS — R7303 Prediabetes: Secondary | ICD-10-CM | POA: Diagnosis not present

## 2020-07-03 DIAGNOSIS — Z6838 Body mass index (BMI) 38.0-38.9, adult: Secondary | ICD-10-CM

## 2020-07-03 DIAGNOSIS — Z9884 Bariatric surgery status: Secondary | ICD-10-CM

## 2020-07-03 MED ORDER — OZEMPIC (0.25 OR 0.5 MG/DOSE) 2 MG/1.5ML ~~LOC~~ SOPN: 0.2500 mg | PEN_INJECTOR | SUBCUTANEOUS | 1 refills | Status: DC

## 2020-07-03 MED ORDER — FLUCONAZOLE 150 MG PO TABS: ORAL_TABLET | ORAL | 0 refills | Status: DC

## 2020-07-03 MED ORDER — AZITHROMYCIN 250 MG PO TABS: ORAL_TABLET | ORAL | 0 refills | Status: DC

## 2020-07-03 MED ORDER — BENZONATATE 200 MG PO CAPS: 200.0000 mg | ORAL_CAPSULE | Freq: Three times a day (TID) | ORAL | 0 refills | Status: DC | PRN

## 2020-07-03 NOTE — Assessment & Plan Note (Signed)
Patient with history of bariatric surgery with increasing weight and also has PCOS with prediabetes.  Think she would benefit from going back on GLP-1 therapy.  We discussed after she has resolved her current respiratory illness recommend she try the medication again at 0.25 mg once a week.  She has follow-up scheduled

## 2020-07-03 NOTE — Progress Notes (Signed)
   Subjective:    Patient ID: Abigail Duran, female    DOB: 29-Jul-1975, 45 y.o.   MRN: [id number]  Patient presents for Follow-up (Ozempic) and Illness (Cough, back pain, fatigue- has been COVID tested- negative/)  Patient here to discuss restarting Ozempic.  She is on medication back in October but then she felt sick and she was unclear if there was any relation to the Ozempic but it seems it was not.  She notes that her weight continues to go lab she has history of bariatric surgery and is concerned about increasing weight and she was doing well with the Ozempic before.  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 No sinus pressure, no GI symptoms She is taking vitamin D, zinc She is not resting as well  She has not been taking her lexapro regulary she has not taken consistently  No UTI symptoms     Review Of Systems:  GEN- +fatigue, +fever, weight loss,weakness, recent illness HEENT- denies eye drainage, change in vision, nasal discharge, CVS- denies chest pain, palpitations RESP- denies SOB, +cough, wheeze ABD- denies N/V, change in stools, abd pain GU- denies dysuria, hematuria, dribbling, incontinence MSK- denies joint pain, muscle aches, injury Neuro- denies headache, dizziness, syncope, seizure activity       Objective:    BP 124/70   Pulse 80   Temp 98.2 F (36.8 C) (Temporal)   Resp 14   Ht 5\' 3"  (1.6 m)   Wt 220 lb (99.8 kg)   SpO2 96%   BMI 38.97 kg/m  GEN- NAD, alert and oriented x3 HEENT- PERRL, EOMI, non injected sclera, pink conjunctiva, MMM, oropharynx clear, no sinus tenderness TM clear no effusion Neck- Supple, no lymphadenopathy CVS- RRR, no murmur RESP-CTAB ABD-NABS,soft,NT,ND EXT- No edema Pulses- Radial,  2+        Assessment & Plan:      Problem List Items Addressed This Visit      Unprioritized   History of bariatric surgery   Obesity   Relevant Medications   Semaglutide,0.25 or 0.5MG /DOS, (OZEMPIC, 0.25 OR 0.5 MG/DOSE,) 2 MG/1.5ML SOPN   Pre-diabetes - Primary    Patient with history of bariatric surgery with increasing weight and also has PCOS with prediabetes.  Think she would benefit from going back on GLP-1 therapy.  We discussed after she has resolved her current respiratory illness recommend she try the medication again at 0.25 mg once a week.  She has follow-up scheduled       Other Visit Diagnoses    Respiratory illness       Patient with ongoing respiratory illness which is prolonged.  She is not febrile in the office and saturations are normal with multiple negative Covid testing.  She is medically vaccinated.  At this time then add on azithromycin to cover atypical respiratory illness she will also be given Tessalon Perles for the cough.  She is still not improved will recommend chest x-ray and labs as the next step.      Note: This dictation was prepared with Dragon dictation along with smaller phrase technology. Any transcriptional errors that result from this process are unintentional.

## 2020-07-03 NOTE — Patient Instructions (Signed)
F/U 2 MONTHS

## 2020-12-27 ENCOUNTER — Encounter (HOSPITAL_COMMUNITY): Payer: Self-pay | Admitting: *Deleted

## 2021-01-29 NOTE — Progress Notes (Signed)
BP (!) 132/100   Pulse 92   Temp 98.7 F (37.1 C)   Ht 5\' 3"  (1.6 m)   Wt 218 lb 6.4 oz (99.1 kg)   SpO2 99%   BMI 38.69 kg/m    Subjective:    Patient ID: , female    DOB: 1976-04-13, 45 y.o.   MRN: [id number]  HPI: Abigail Duran is a 45 y.o. female presenting on 01/30/2021 for comprehensive medical examination. Current medical complaints include:  Bumps on perineum - reports she had 04/01/2021 wax 3 weeks ago.  She felt some ingrown hairs afterward and contacted the esthecian who recommended using bacterial soap and exfoliation.  She states she did all of those things and she is still having issues.  STD SCREENING Sexual activity:  In a Monogamous Relationship Contraception: no Recent unprotected intercourse: yes Genital lesions: no Vaginal discharge: no Dysuria: no Swollen lymph nodes: no Fevers: no Rash: no  1 female partner, uses protection sometimes.  Requests STD screening today.  History of PCOS - does not think she would get pregnant.  Would be indifferent if she got pregnant.  She does not want to start on contraception at "her age."  DEPRESSION Stopped taking Lexapro - did not feel like it was working.  She worries "all of the time."  Reports her biggest struggle is with sleeping - she cannot get consistent sleep.  Works in [place] and stressed with COVID-19.   Mood status: uncontrolled Satisfied with current treatment?: no Symptom severity: moderate  Duration of current treatment : n/a Side effects: n/a Medication compliance: n/a Psychotherapy/counseling: yes; sees Dr. Teacher, music , last saw in July and sees regularly. Previous psychiatric medications: Lexapro and prn alprazolam Depressed mood: yes Anxious mood: yes Anhedonia: yes Significant weight loss or gain: no Insomnia: yes Fatigue: yes Feelings of worthlessness or guilt: yes Impaired concentration/indecisiveness: yes Suicidal ideations: no Hopelessness: yes Crying spells:  no Depression screen [facility] [place] 2/9 01/30/2021 04/09/2020 01/24/2020 05/01/2019 03/01/2019  Decreased Interest 1 1 2 1 1   Down, Depressed, Hopeless 1 2 2 1 1   PHQ - 2 Score 2 3 4 2 2   Altered sleeping 2 1 2 1 1   Tired, decreased energy 2 2 2  0 1  Change in appetite 1 0 0 0 1  Feeling bad or failure about yourself  2 1 1  0 0  Trouble concentrating 2 0 1 0 0  Moving slowly or fidgety/restless 2 0 0 0 0  Suicidal thoughts 0 0 0 0 0  PHQ-9 Score 13 7 10 3 5   Difficult doing work/chores Somewhat difficult Somewhat difficult Somewhat difficult Not difficult at all Somewhat difficult    GAD 7 : Generalized Anxiety Score 01/30/2021 01/24/2020 03/01/2019  Nervous, Anxious, on Edge 2 2 0  Control/stop worrying 2 2 0  Worry too much - different things 2 2 0  Trouble relaxing 2 2 0  Restless 1 2 0  Easily annoyed or irritable 2 2 0  Afraid - awful might happen 2 2 0  Total GAD 7 Score 13 14 0  Anxiety Difficulty Somewhat difficult Very difficult Not difficult at all   She currently lives with: self LMP: 01/25/21  The patient does not have a history of falls. I did not complete a risk assessment for falls. A plan of care for falls was not documented.   Past Medical History:  Past Medical History:  Diagnosis Date   CIN I (cervical intraepithelial neoplasia I) 07/30/2010   Headache  History of PCOS 2008   History of varicella    Irregular periods/menstrual cycles 12/14/06   LGSIL (low grade squamous intraepithelial lesion) on Pap smear 08/13/09   colpo   Obesity    Pre-diabetes    Wears glasses    Yeast infection    hx/o    Surgical History:  Past Surgical History:  Procedure Laterality Date   COLPOSCOPY  2010   LAPAROSCOPIC GASTRIC SLEEVE RESECTION N/A 05/31/2017   Procedure: LAPAROSCOPIC GASTRIC SLEEVE RESECTION, UPPER ENDOSCOPY;  Surgeon: Rodman Pickle, MD;  Location: [facility];  Service: General;  Laterality: N/A;    Medications:  Current Outpatient Medications on File Prior to Visit   Medication Sig   acetaminophen (TYLENOL) 500 MG tablet Take 1,000 mg by mouth every 6 (six) hours as needed for moderate pain or headache.   azithromycin (ZITHROMAX) 250 MG tablet Take 2 tablets x 1 day, then 1 tablet daily x 4 days   cholecalciferol (VITAMIN D3) 25 MCG (1000 UNIT) tablet Take 1,000 Units by mouth daily.   clotrimazole-betamethasone (LOTRISONE) cream Apply 1 application topically 2 (two) times daily.   No current facility-administered medications on file prior to visit.    Allergies:  Allergies  Allergen Reactions   Codeine Nausea And Vomiting    Social History:  Social History   Socioeconomic History   Marital status: Divorced    Spouse name: Not on file   Number of children: Not on file   Years of education: Not on file   Highest education level: Not on file  Occupational History   Not on file  Tobacco Use   Smoking status: Never   Smokeless tobacco: Never  Vaping Use   Vaping Use: Never used  Substance and Sexual Activity   Alcohol use: No    Comment: occasionally    Drug use: No   Sexual activity: Yes  Other Topics Concern   Not on file  Social History Narrative   Lives alone, 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  Exercise - walking and weight loss challenge at work.  Hip hop dance.  Usually walks at [facility].  No children.  No current significant other.  As of 08/2014.   Social Determinants of Health   Financial Resource Strain: Not on file  Food Insecurity: Not on file  Transportation Needs: Not on file  Physical Activity: Not on file  Stress: Not on file  Social Connections: Not on file  Intimate Partner Violence: Not on file   Social History   Tobacco Use  Smoking Status Never  Smokeless Tobacco Never   Social History   Substance and Sexual Activity  Alcohol Use No   Comment: occasionally     Family History:  Family History  Problem Relation Age of Onset   Asthma Mother    Depression Mother    Hypertension  Mother    Other Mother        back surgery   COPD Mother    Alcohol abuse Father    Early death Father    Hypertension Father    Cirrhosis Father        died of cirrhosis   Sickle cell trait Brother    Hypertension Brother    Heart disease Maternal Grandmother    Diabetes Sister    Lupus Sister    Cancer Neg Hx    Stroke Neg Hx     Past medical history, surgical history, medications, allergies, family history and social history reviewed  with patient today and changes made to appropriate areas of the chart.   Review of Systems  Constitutional: Negative.   HENT: Negative.    Eyes: Negative.   Respiratory: Negative.    Cardiovascular: Negative.   Gastrointestinal: Negative.   Genitourinary: Negative.   Musculoskeletal: Negative.   Skin:  Positive for rash.  Neurological: Negative.   Psychiatric/Behavioral:  The patient is nervous/anxious and has insomnia.       Objective:    BP (!) 132/100   Pulse 92   Temp 98.7 F (37.1 C)   Ht 5\' 3"  (1.6 m)   Wt 218 lb 6.4 oz (99.1 kg)   SpO2 99%   BMI 38.69 kg/m   Wt Readings from Last 3 Encounters:  01/30/21 218 lb 6.4 oz (99.1 kg)  07/03/20 220 lb (99.8 kg)  05/10/20 213 lb (96.6 kg)    Physical Exam Vitals and nursing note reviewed. Exam conducted with a chaperone present (AW).  Constitutional:      General: She is not in acute distress.    Appearance: Normal appearance. She is obese. She is not ill-appearing or toxic-appearing.  HENT:     Head: Normocephalic and atraumatic.     Right Ear: Tympanic membrane, ear canal and external ear normal. There is impacted cerumen.     Left Ear: Tympanic membrane, ear canal and external ear normal.     Nose: Nose normal. No congestion or rhinorrhea.     Mouth/Throat:     Mouth: Mucous membranes are moist.     Pharynx: Oropharynx is clear. No oropharyngeal exudate.  Eyes:     General: No scleral icterus.    Extraocular Movements: Extraocular movements intact.     Pupils: Pupils  are equal, round, and reactive to light.  Cardiovascular:     Rate and Rhythm: Normal rate and regular rhythm.     Pulses: Normal pulses.     Heart sounds: Normal heart sounds. No murmur heard. Pulmonary:     Effort: Pulmonary effort is normal. No respiratory distress.     Breath sounds: No wheezing or rhonchi.  Chest:  Breasts:    Right: Normal. No inverted nipple, mass, nipple discharge or skin change.     Left: Normal. No inverted nipple, mass, nipple discharge or skin change.  Abdominal:     General: Abdomen is flat. Bowel sounds are normal. There is no distension.     Palpations: Abdomen is soft.     Tenderness: There is no abdominal tenderness.     Hernia: There is no hernia in the left inguinal area or right inguinal area.  Genitourinary:    Exam position: Lithotomy position.     Vagina: Bleeding present.     Cervix: Normal.     Uterus: Normal.      Adnexa: Right adnexa normal and left adnexa normal.       Right: No tenderness or fullness.         Left: No tenderness or fullness.       Comments: Pinpoint flesh colored papules appreciated to vulva, no erythema, warmth, oozing/drainage.   Musculoskeletal:        General: No swelling or tenderness. Normal range of motion.     Cervical back: Normal range of motion and neck supple. No rigidity or tenderness.     Right lower leg: No edema.     Left lower leg: No edema.  Lymphadenopathy:     Lower Body: No right inguinal adenopathy. No left inguinal adenopathy.  Skin:    General: Skin is warm and dry.     Capillary Refill: Capillary refill takes less than 2 seconds.     Coloration: Skin is not jaundiced or pale.  Neurological:     General: No focal deficit present.     Mental Status: She is alert and oriented to person, place, and time.     Motor: No weakness.     Gait: Gait normal.  Psychiatric:        Mood and Affect: Mood normal.        Behavior: Behavior normal.        Thought Content: Thought content normal.         Judgment: Judgment normal.      Assessment & Plan:   Problem List Items Addressed This Visit       Other   Pre-diabetes    Last A1c 03/2020 5.9%.  She has meeting set up with medical weight management.  She has had bariatric surgery in the past.  We will check HgbA1c today along with CMP.  Referral placed to medical weight management for continuity of care.  Continue collaboration.       Relevant Orders   Hemoglobin A1c   COMPLETE METABOLIC PANEL WITH GFR   Magnesium   Obesity    Discussed dietary and lifestyle changes.  Goal of physical activity is 30 minutes 5 times weekly.  She has meeting set up with medical weight management and referral has been placed.  Continue this collaboration.       Relevant Orders   Lipid panel   Amb Ref to Medical Weight Management   History of bariatric surgery    Will check vitamin levels today including Vitamin D and Vitamin B12, magnesium to determine best way to treat possibly vitamin deficiencies.        Elevated BP without diagnosis of hypertension    Acute.  Improved upon recheck, although diastolic remains elevated above 90.  I suspect this may in part be related to her underlying anxiety.  We are planning to treat anxiety today with medication.  In the meantime, I have asked the patient to check her blood pressure at home a few times per week and notify us with the readings in 1-2 weeks.       Depression with anxiety    Chronic, uncontrolled.  PHQ-9 and GAD-7 elevated today.  No SI/HI.  Has therapist she follows with regularly - continue collaboration.  Will resume Lexapro 10 mg - she will try to take first thing in the morning instead of at night.  Will start hydroxyzine 25 mg tablets at night time to help with sleep. Follow up in 1 month.       Relevant Medications   escitalopram (LEXAPRO) 10 MG tablet   hydrOXYzine (ATARAX/VISTARIL) 25 MG tablet   Other Relevant Orders   CBC with Differential/Platelet   COMPLETE METABOLIC PANEL  WITH GFR   TSH   Other Visit Diagnoses     Annual physical exam    -  Primary   Vitamin D deficiency       Relevant Orders   VITAMIN D 25 Hydroxy (Vit-D Deficiency, Fractures)   B12 deficiency       Relevant Orders   Vitamin B12   Screening for cervical cancer       Relevant Orders   PAP,TP IMGw/HPV RNA,rflx [id number],40/[id number],18/45   Encounter for screening mammogram for malignant neoplasm of breast       Relevant  Orders   MM 3D SCREEN BREAST BILATERAL   Screening for STDs (sexually transmitted diseases)       Relevant Orders   Trichomonas vaginalis, RNA   RPR   HIV Antibody (routine testing w rflx)   C. trachomatis/N. gonorrhoeae RNA        Follow up plan: Return for 1 month mood f/u.   LABORATORY TESTING:  - Pap smear: pap done  IMMUNIZATIONS:   - Tdap: Tetanus vaccination status reviewed: last tetanus booster within 10 years. - Influenza: Postponed to flu season - Pneumovax: Not applicable - Prevnar: Not applicable - HPV: Not applicable - Zostavax vaccine: Not applicable - COVID-19 vaccine: Has had 2 doses of Moderna  SCREENING: -Mammogram: Ordered today  - Colonoscopy: Not applicable  - Bone Density: Not applicable  -Hearing Test: Not applicable  -Spirometry: Not applicable   PATIENT COUNSELING:   Advised to take 1 mg of folate supplement per day if capable of pregnancy.   Sexuality: Discussed sexually transmitted diseases, partner selection, use of condoms, avoidance of unintended pregnancy  and contraceptive alternatives.   Advised to avoid cigarette smoking.  I discussed with the patient that most people either abstain from alcohol or drink within safe limits (<=14/week and <=4 drinks/occasion for males, <=7/weeks and <= 3 drinks/occasion for females) and that the risk for alcohol disorders and other health effects rises proportionally with the number of drinks per week and how often a drinker exceeds daily limits.  Discussed cessation/primary prevention of  drug use and availability of treatment for abuse.   Diet: Encouraged to adjust caloric intake to maintain  or achieve ideal body weight, to reduce intake of dietary saturated fat and total fat, to limit sodium intake by avoiding high sodium foods and not adding table salt, and to maintain adequate dietary potassium and calcium preferably from fresh fruits, vegetables, and low-fat dairy products.    stressed the importance of regular exercise  Injury prevention: Discussed safety belts, safety helmets, smoke detector, smoking near bedding or upholstery.   Dental health: Discussed importance of regular tooth brushing, flossing, and dental visits.    NEXT PREVENTATIVE PHYSICAL DUE IN 1 YEAR. Return for 1 month mood f/u.

## 2021-01-30 ENCOUNTER — Encounter: Payer: Self-pay | Admitting: Nurse Practitioner

## 2021-01-30 ENCOUNTER — Ambulatory Visit (INDEPENDENT_AMBULATORY_CARE_PROVIDER_SITE_OTHER): Payer: No Typology Code available for payment source | Admitting: Nurse Practitioner

## 2021-01-30 ENCOUNTER — Other Ambulatory Visit: Payer: Self-pay

## 2021-01-30 VITALS — BP 132/100 | HR 92 | Temp 98.7°F | Ht 63.0 in | Wt 218.4 lb

## 2021-01-30 DIAGNOSIS — Z0001 Encounter for general adult medical examination with abnormal findings: Secondary | ICD-10-CM

## 2021-01-30 DIAGNOSIS — E538 Deficiency of other specified B group vitamins: Secondary | ICD-10-CM

## 2021-01-30 DIAGNOSIS — F418 Other specified anxiety disorders: Secondary | ICD-10-CM

## 2021-01-30 DIAGNOSIS — Z1231 Encounter for screening mammogram for malignant neoplasm of breast: Secondary | ICD-10-CM

## 2021-01-30 DIAGNOSIS — Z113 Encounter for screening for infections with a predominantly sexual mode of transmission: Secondary | ICD-10-CM

## 2021-01-30 DIAGNOSIS — R7303 Prediabetes: Secondary | ICD-10-CM | POA: Diagnosis not present

## 2021-01-30 DIAGNOSIS — Z124 Encounter for screening for malignant neoplasm of cervix: Secondary | ICD-10-CM

## 2021-01-30 DIAGNOSIS — E559 Vitamin D deficiency, unspecified: Secondary | ICD-10-CM

## 2021-01-30 DIAGNOSIS — R030 Elevated blood-pressure reading, without diagnosis of hypertension: Secondary | ICD-10-CM

## 2021-01-30 DIAGNOSIS — Z0000 Encounter for general adult medical examination without abnormal findings: Secondary | ICD-10-CM

## 2021-01-30 DIAGNOSIS — Z9884 Bariatric surgery status: Secondary | ICD-10-CM

## 2021-01-30 DIAGNOSIS — Z6838 Body mass index (BMI) 38.0-38.9, adult: Secondary | ICD-10-CM

## 2021-01-30 MED ORDER — HYDROXYZINE HCL 25 MG PO TABS: 25.0000 mg | ORAL_TABLET | Freq: Every evening | ORAL | 1 refills | Status: DC | PRN

## 2021-01-30 MED ORDER — ESCITALOPRAM OXALATE 10 MG PO TABS: 10.0000 mg | ORAL_TABLET | Freq: Every day | ORAL | 1 refills | Status: DC

## 2021-01-30 NOTE — Assessment & Plan Note (Signed)
Last A1c 03/2020 5.9%.  She has meeting set up with medical weight management.  She has had bariatric surgery in the past.  We will check HgbA1c today along with CMP.  Referral placed to medical weight management for continuity of care.  Continue collaboration.

## 2021-01-30 NOTE — Assessment & Plan Note (Signed)
Discussed dietary and lifestyle changes.  Goal of physical activity is 30 minutes 5 times weekly.  She has meeting set up with medical weight management and referral has been placed.  Continue this collaboration.

## 2021-01-30 NOTE — Assessment & Plan Note (Signed)
Chronic, uncontrolled.  PHQ-9 and GAD-7 elevated today.  No SI/HI.  Has therapist she follows with regularly - continue collaboration.  Will resume Lexapro 10 mg - she will try to take first thing in the morning instead of at night.  Will start hydroxyzine 25 mg tablets at night time to help with sleep. Follow up in 1 month.

## 2021-01-30 NOTE — Assessment & Plan Note (Signed)
Will check vitamin levels today including Vitamin D and Vitamin B12, magnesium to determine best way to treat possibly vitamin deficiencies.

## 2021-01-30 NOTE — Assessment & Plan Note (Signed)
Acute.  Improved upon recheck, although diastolic remains elevated above 90.  I suspect this may in part be related to her underlying anxiety.  We are planning to treat anxiety today with medication.  In the meantime, I have asked the patient to check her blood pressure at home a few times per week and notify us with the readings in 1-2 weeks.

## 2021-01-30 NOTE — Patient Instructions (Addendum)
Nice meeting you, Brittney!  We will let you know with lab work and pap results.  Go ahead and call to schedule your mammogram.  Be sure to check your blood pressure at work and let us know with the readings in ~1-2 weeks.  We will plan to see you again in 1 month see if your mood or sleep has improved.

## 2021-01-31 LAB — CBC WITH DIFFERENTIAL/PLATELET
Absolute Monocytes: 274 cells/uL (ref 200–950)
Basophils Absolute: 42 cells/uL (ref 0–200)
Basophils Relative: 1.1 %
Eosinophils Absolute: 110 cells/uL (ref 15–500)
Eosinophils Relative: 2.9 %
HCT: 37.3 % (ref 35.0–45.0)
Hemoglobin: 11.6 g/dL — ABNORMAL LOW (ref 11.7–15.5)
Lymphs Abs: 1611 cells/uL (ref 850–3900)
MCH: 26.7 pg — ABNORMAL LOW (ref 27.0–33.0)
MCHC: 31.1 g/dL — ABNORMAL LOW (ref 32.0–36.0)
MCV: 85.9 fL (ref 80.0–100.0)
MPV: 10.9 fL (ref 7.5–12.5)
Monocytes Relative: 7.2 %
Neutro Abs: 1763 cells/uL (ref 1500–7800)
Neutrophils Relative %: 46.4 %
Platelets: 256 10*3/uL (ref 140–400)
RBC: 4.34 10*6/uL (ref 3.80–5.10)
RDW: 14 % (ref 11.0–15.0)
Total Lymphocyte: 42.4 %
WBC: 3.8 10*3/uL (ref 3.8–10.8)

## 2021-01-31 LAB — PAP, TP IMAGING W/ HPV RNA, RFLX HPV TYPE 16,18/45: HPV DNA High Risk: NOT DETECTED

## 2021-01-31 LAB — COMPLETE METABOLIC PANEL WITH GFR
AG Ratio: 1.3 (calc) (ref 1.0–2.5)
ALT: 8 U/L (ref 6–29)
AST: 15 U/L (ref 10–30)
Albumin: 3.7 g/dL (ref 3.6–5.1)
Alkaline phosphatase (APISO): 56 U/L (ref 31–125)
BUN: 13 mg/dL (ref 7–25)
CO2: 29 mmol/L (ref 20–32)
Calcium: 8.8 mg/dL (ref 8.6–10.2)
Chloride: 103 mmol/L (ref 98–110)
Creat: 0.84 mg/dL (ref 0.50–0.99)
Globulin: 2.8 g/dL (calc) (ref 1.9–3.7)
Glucose, Bld: 93 mg/dL (ref 65–99)
Potassium: 4.5 mmol/L (ref 3.5–5.3)
Sodium: 139 mmol/L (ref 135–146)
Total Bilirubin: 0.3 mg/dL (ref 0.2–1.2)
Total Protein: 6.5 g/dL (ref 6.1–8.1)
eGFR: 88 mL/min/{1.73_m2} (ref >=60)

## 2021-01-31 LAB — LIPID PANEL
Cholesterol: 200 mg/dL — ABNORMAL HIGH (ref <200)
HDL: 63 mg/dL (ref >=50)
LDL Cholesterol (Calc): 118 mg/dL (calc) — ABNORMAL HIGH
Non-HDL Cholesterol (Calc): 137 mg/dL (calc) — ABNORMAL HIGH (ref <130)
Total CHOL/HDL Ratio: 3.2 (calc) (ref <5.0)
Triglycerides: 88 mg/dL (ref <150)

## 2021-01-31 LAB — VITAMIN D 25 HYDROXY (VIT D DEFICIENCY, FRACTURES): Vit D, 25-Hydroxy: 21 ng/mL — ABNORMAL LOW (ref 30–100)

## 2021-01-31 LAB — TSH: TSH: 1.92 mIU/L

## 2021-01-31 LAB — C. TRACHOMATIS/N. GONORRHOEAE RNA
C. trachomatis RNA, TMA: NOT DETECTED
N. gonorrhoeae RNA, TMA: NOT DETECTED

## 2021-01-31 LAB — VITAMIN B12: Vitamin B-12: >2000 pg/mL — ABNORMAL HIGH (ref 200–1100)

## 2021-01-31 LAB — TRICHOMONAS VAGINALIS, PROBE AMP: Trichomonas vaginalis RNA: NOT DETECTED

## 2021-01-31 LAB — MAGNESIUM: Magnesium: 1.9 mg/dL (ref 1.5–2.5)

## 2021-01-31 LAB — HEMOGLOBIN A1C
Hgb A1c MFr Bld: 5.8 % of total Hgb — ABNORMAL HIGH (ref <5.7)
Mean Plasma Glucose: 120 mg/dL
eAG (mmol/L): 6.6 mmol/L

## 2021-01-31 LAB — RPR: RPR Ser Ql: NONREACTIVE

## 2021-01-31 LAB — HIV ANTIBODY (ROUTINE TESTING W REFLEX): HIV 1&2 Ab, 4th Generation: NONREACTIVE

## 2021-02-13 ENCOUNTER — Ambulatory Visit (INDEPENDENT_AMBULATORY_CARE_PROVIDER_SITE_OTHER): Payer: No Typology Code available for payment source | Admitting: Family Medicine

## 2021-02-22 ENCOUNTER — Other Ambulatory Visit: Payer: Self-pay | Admitting: Nurse Practitioner

## 2021-02-22 DIAGNOSIS — F418 Other specified anxiety disorders: Secondary | ICD-10-CM

## 2021-02-27 ENCOUNTER — Ambulatory Visit (INDEPENDENT_AMBULATORY_CARE_PROVIDER_SITE_OTHER): Payer: No Typology Code available for payment source | Admitting: Family Medicine

## 2021-03-04 ENCOUNTER — Encounter (INDEPENDENT_AMBULATORY_CARE_PROVIDER_SITE_OTHER): Payer: Self-pay | Admitting: Family Medicine

## 2021-03-04 ENCOUNTER — Ambulatory Visit (INDEPENDENT_AMBULATORY_CARE_PROVIDER_SITE_OTHER): Payer: No Typology Code available for payment source | Admitting: Family Medicine

## 2021-03-04 ENCOUNTER — Other Ambulatory Visit: Payer: Self-pay

## 2021-03-04 VITALS — BP 134/91 | HR 83 | Temp 98.1°F | Ht 63.0 in | Wt 217.0 lb

## 2021-03-04 DIAGNOSIS — Z9189 Other specified personal risk factors, not elsewhere classified: Secondary | ICD-10-CM

## 2021-03-04 DIAGNOSIS — R5383 Other fatigue: Secondary | ICD-10-CM

## 2021-03-04 DIAGNOSIS — E559 Vitamin D deficiency, unspecified: Secondary | ICD-10-CM

## 2021-03-04 DIAGNOSIS — R0683 Snoring: Secondary | ICD-10-CM

## 2021-03-04 DIAGNOSIS — Z1331 Encounter for screening for depression: Secondary | ICD-10-CM | POA: Diagnosis not present

## 2021-03-04 DIAGNOSIS — E282 Polycystic ovarian syndrome: Secondary | ICD-10-CM | POA: Insufficient documentation

## 2021-03-04 DIAGNOSIS — Z0289 Encounter for other administrative examinations: Secondary | ICD-10-CM

## 2021-03-04 DIAGNOSIS — E66812 Obesity, class 2: Secondary | ICD-10-CM

## 2021-03-04 DIAGNOSIS — R7303 Prediabetes: Secondary | ICD-10-CM | POA: Diagnosis not present

## 2021-03-04 DIAGNOSIS — R0602 Shortness of breath: Secondary | ICD-10-CM | POA: Diagnosis not present

## 2021-03-04 DIAGNOSIS — Z903 Acquired absence of stomach [part of]: Secondary | ICD-10-CM

## 2021-03-04 DIAGNOSIS — D649 Anemia, unspecified: Secondary | ICD-10-CM

## 2021-03-04 DIAGNOSIS — F411 Generalized anxiety disorder: Secondary | ICD-10-CM

## 2021-03-04 DIAGNOSIS — Z6838 Body mass index (BMI) 38.0-38.9, adult: Secondary | ICD-10-CM

## 2021-03-04 MED ORDER — PHENTERMINE HCL 37.5 MG PO TABS: 18.7500 mg | ORAL_TABLET | Freq: Every day | ORAL | 0 refills | Status: DC

## 2021-03-04 MED ORDER — TROKENDI XR 25 MG PO CP24: 25.0000 mg | ORAL_CAPSULE | Freq: Every day | ORAL | 0 refills | Status: DC
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
## 2021-03-05 NOTE — Progress Notes (Signed)
Subjective:    Patient ID: Abigail Duran, female    DOB: [birth year], 45 y.o.   MRN: [id number]  HPI: Abigail Duran is a 45 y.o. female presenting for follow up.  Chief Complaint  Patient presents with   Follow-up    BP   Illness    URI- x3 negative COVID tests- head congestion, hoarseness, cough with green mucus   UPPER RESPIRATORY TRACT INFECTION Onset: Monday  COVID-19 testing history: tested negative x 3 this occurrence COVID-19 vaccination status: had had 2 doses of Moderna Fever:  no, some subjective hot/cold chills Cough: yes; night time when laying down and first thing in the morning Shortness of breath: no Wheezing: no Chest pain: no Chest tightness: no Chest congestion: yes; worse at night Nasal congestion: no Runny nose: yes Post nasal drip: yes Sneezing: no Sore throat: no Swollen glands: no Sinus pressure: no Headache: yes Face pain: no Toothache: no Ear pain: no  Ear pressure: no  Eyes red/itching:no Eye drainage/crusting: no  Nausea: no  Vomiting: no Diarrhea: no  Change in appetite: no  Loss of taste/smell: no  Rash: no Fatigue: yes Sick contacts: no Strep contacts: no  Context: better Recurrent sinusitis: no Treatments attempted: Dayquil Relief with OTC medications: yes  DEPRESSION Feels her mood is better.  She is trying to let go of work and things she cannot control more.  She took Lexapro for about 1 week and noticed palpitations at night time and stopped taking it.  She has not tried hydroxyzine.   HIGH BLOOD PRESSURE WITHOUT DIAGNOSIS OF HYPERTENSION Blood pressure high at last visit.  Home readings were slightly elevated after last visit; 140s/80s - 130/70s.  Now consistently in 120s/70s.   Recurrent headaches: no Visual changes: no Palpitations: no Dyspnea: no Chest pain: no Lower extremity edema: no Dizzy/lightheaded: no  ANEMIA Anemia status: stable Etiology of anemia: heavy menses vs. History of bariatric  surgery Duration of anemia treatment: not currently on treatment Compliance with treatment: n/a Iron supplementation side effects: n/a Severity of anemia: mild Fatigue: no Decreased exercise tolerance: no  Dyspnea on exertion: no Palpitations: no Bleeding: no Pica: no  Allergies  Allergen Reactions   Codeine Nausea And Vomiting    Outpatient Encounter Medications as of 03/06/2021  Medication Sig   fluticasone (FLONASE) 50 MCG/ACT nasal spray Place 2 sprays into both nostrils daily.   phentermine (ADIPEX-P) 37.5 MG tablet Take 0.5 tablets (18.75 mg total) by mouth daily before breakfast.   Topiramate ER (TROKENDI XR) 25 MG CP24 Take 1 capsule (25 mg total) by mouth daily at 12 noon.   No facility-administered encounter medications on file as of 03/06/2021.    Patient Active Problem List   Diagnosis Date Noted   Anemia 03/06/2021   Vitamin D deficiency 03/04/2021   PCOS (polycystic ovarian syndrome) 03/04/2021   Pre-diabetes 04/10/2020   Depression with anxiety 04/09/2020   History of bariatric surgery 03/01/2019   History of abnormal cervical Pap smear 08/28/2014   Elevated BP without diagnosis of hypertension 08/28/2014    Past Medical History:  Diagnosis Date   Anemia    Anxiety    Back pain    CIN I (cervical intraepithelial neoplasia I) 07/30/2010   Headache    History of PCOS 2008   History of varicella    Irregular periods/menstrual cycles 12/14/2006   LGSIL (low grade squamous intraepithelial lesion) on Pap smear 08/13/2009   colpo   Obesity    Pre-diabetes  Vitamin D deficiency    Wears glasses    Yeast infection    hx/o    Relevant past medical, surgical, family and social history reviewed and updated as indicated. Interim medical history since our last visit reviewed.  Review of Systems Per HPI unless specifically indicated above     Objective:    BP 124/78   Pulse 90   Temp 98.7 F (37.1 C) (Temporal)   Resp 14   Ht 5\' 3"  (1.6 m)   Wt  220 lb (99.8 kg)   SpO2 97%   BMI 38.97 kg/m   Wt Readings from Last 3 Encounters:  03/06/21 220 lb (99.8 kg)  03/04/21 217 lb (98.4 kg)  01/30/21 218 lb 6.4 oz (99.1 kg)    Physical Exam Vitals and nursing note reviewed.  Constitutional:      General: She is not in acute distress.    Appearance: Normal appearance. She is not toxic-appearing.  HENT:     Head: Normocephalic and atraumatic.     Right Ear: Tympanic membrane, ear canal and external ear normal.     Left Ear: Tympanic membrane, ear canal and external ear normal.     Nose: Nose normal.     Mouth/Throat:     Mouth: Mucous membranes are moist.     Pharynx: Oropharynx is clear. No oropharyngeal exudate or posterior oropharyngeal erythema.  Eyes:     General: No scleral icterus.       Right eye: No discharge.     Extraocular Movements: Extraocular movements intact.  Neck:     Vascular: No carotid bruit.  Cardiovascular:     Rate and Rhythm: Normal rate and regular rhythm.     Heart sounds: Normal heart sounds. No murmur heard. Pulmonary:     Effort: Pulmonary effort is normal. No respiratory distress.     Breath sounds: Normal breath sounds. No wheezing, rhonchi or rales.  Musculoskeletal:        General: Normal range of motion.     Cervical back: Normal range of motion and neck supple. No rigidity.     Right lower leg: No edema.     Left lower leg: No edema.  Lymphadenopathy:     Cervical: No cervical adenopathy.  Skin:    General: Skin is warm and dry.     Capillary Refill: Capillary refill takes less than 2 seconds.     Coloration: Skin is not jaundiced or pale.     Findings: No erythema.  Neurological:     Mental Status: She is alert and oriented to person, place, and time.     Motor: No weakness.     Gait: Gait normal.  Psychiatric:        Mood and Affect: Mood normal.        Behavior: Behavior normal.        Thought Content: Thought content normal.        Judgment: Judgment normal.      Assessment  & Plan:   Problem List Items Addressed This Visit       Other   Elevated BP without diagnosis of hypertension    Blood pressure is much improved today, suspect related to improved anxiety and depression.  Follow up as needed.      Depression with anxiety - Primary    Chronic, improved.  Will discontinue use of Lexapro.  She can still use hydroxyzine as needed for panic or to help with sleep.  Continue collaboration with counselor.  Follow up if she desires further medical treatment.      Anemia    Acute.  Will check CBC again today.  Etiologies include heavy menses vs. Iron absorption issue with history of bariatric surgery.  Consider iron supplementation if she remains anemic.  Follow up pending results.      Relevant Orders   CBC with Differential   Other Visit Diagnoses     Cough       Relevant Medications   fluticasone (FLONASE) 50 MCG/ACT nasal spray      Cough Acute.  Suspect upper respiratory viral illness.  Reassured patient that symptoms and exam findings are most consistent with a viral upper respiratory infection and explained lack of efficacy of antibiotics against viruses.  Discussed expected course and features suggestive of secondary bacterial infection.  Continue supportive care. Increase fluid intake with water or electrolyte solution like pedialyte. Encouraged acetaminophen as needed for fever/pain. Encouraged salt water gargling, chloraseptic spray and throat lozenges. Encouraged OTC guaifenesin. Encouraged saline sinus flushes and/or neti with humidified air.  Follow up if no improvement early next week.  - fluticasone (FLONASE) 50 MCG/ACT nasal spray; Place 2 sprays into both nostrils daily.  Dispense: 16 g; Refill: 6     Follow up plan: Return in about 6 months (around 09/03/2021), or if symptoms worsen or fail to improve.

## 2021-03-05 NOTE — Progress Notes (Signed)
Dear Abigail MarseillesJessica Martinez, NP,   Thank you for referring Abigail Duran to our clinic. The following note includes my evaluation and treatment recommendations.  Chief Complaint:   OBESITY Abigail Duran (MR# [id number]) is a 45 y.o. female who presents for evaluation and treatment of obesity and related comorbidities. Current BMI is Body mass index is 38.44 kg/m. Abigail Duran has been struggling with her weight for many years and has been unsuccessful in either losing weight, maintaining weight loss, or reaching her healthy weight goal.  Abigail Duran is currently in the action stage of change and ready to dedicate time achieving and maintaining a healthier weight. Abigail Duran is interested in becoming our patient and working on intensive lifestyle modifications including (but not limited to) diet and exercise for weight loss.  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  She is single and lives alone.  She gets 8,000-9,000 steps per day (loves to walk).  She endorses night eating - 5/7 days.  She usually sleeps 4-6 hours per night.  Abigail Duran has tried phentermine in the past and tolerated it well.  Lexapro caused her heart to race.  She says Ozempic, "makes me feel bad" - tired - weight loss 5-10 pounds.  Reports feeling bad on metformin.  She was taking chewable B12.  Abigail Duran habits were reviewed today and are as follows: her desired weight loss is 50 pounds, she has been heavy most of her life, she started gaining weight after 5 years of marriage, her heaviest weight ever was 280-300 pounds, she craves sweets, she snacks frequently in the evenings, she wakes up frequently in the middle of the night to eat, she skips breakfast frequently, she is frequently drinking liquids with calories, and she struggles with emotional eating.  Depression Screen Abigail Duran Food and Mood (modified PHQ-9) score was 11.  Depression screen PHQ 2/9 03/04/2021  Decreased Interest 1  Down, Depressed, Hopeless 1   PHQ - 2 Score 2  Altered sleeping 2  Tired, decreased energy 2  Change in appetite 2  Feeling bad or failure about yourself  2  Trouble concentrating 1  Moving slowly or fidgety/restless 0  Suicidal thoughts 0  PHQ-9 Score 11  Difficult doing work/chores Somewhat difficult  Some recent data might be hidden   Assessment/Plan:   1. Other fatigue Abigail Duran denies daytime somnolence and reports waking up still tired. Patent has a history of symptoms of morning fatigue, morning headache, and snoring. Abigail Duran generally gets  4-6  hours of sleep per night, and states that she has poor sleep quality. Snoring is present. Apneic episodes are not present. Epworth Sleepiness Score is 3.  Abigail Duran does feel that her weight is causing her energy to be lower than it should be. Fatigue may be related to obesity, depression or many other causes. Labs will be ordered, and in the meanwhile, Abigail Duran will focus on self care including making healthy food choices, increasing physical activity and focusing on stress reduction.  - EKG 12-Lead  2. SOB (shortness of breath) on exertion Abigail Duran notes increasing shortness of breath with exercising and seems to be worsening over time with weight gain. She notes getting out of breath sooner with activity than she used to. This has not gotten worse recently. Abigail Duran denies shortness of breath at rest or orthopnea.  3. Prediabetes Not optimized. Goal is HgbA1c < 5.7.  Medication: None.    Plan:  She will continue to focus on protein-rich, low simple carbohydrate  foods. We reviewed the importance of hydration, regular exercise for stress reduction, and restorative sleep.   Lab Results  Component Value Date   HGBA1C 5.8 (H) 01/30/2021   4. Vitamin D deficiency Not at goal.   Plan: Follow-up for routine testing of Vitamin D, at least 2-3 times per year to avoid over-replacement.  Lab Results  Component Value Date   VD25OH 21 (L) 01/30/2021   VD25OH 40 01/24/2020   VD25OH  14 (L) 03/01/2019   5. PCOS (polycystic ovarian syndrome) She will continue to focus on protein-rich, low simple carbohydrate foods. We reviewed the importance of hydration, regular exercise for stress reduction, and restorative sleep. Medication: None.  Counseling PCOS is a leading cause of menstrual irregularities and infertility. It is also associated with obesity, hirsutism (excessive hair growth on the face, chest, or back), and cardiovascular risk factors such as high cholesterol and insulin resistance. Insulin resistance appears to play a central role.  Women with PCOS have been shown to have impaired appetite-regulating hormones. Metformin is one medication that can improve metabolic parameters.  Women with polycystic ovary syndrome (PCOS) have an increased risk for cardiovascular disease (CVD) - European Journal of Preventive Cardiology.  6. 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  Abigail Duran is at risk for malnutrition due to her previous bariatric surgery.    Counseling You may need to eat 3 meals and 2 snacks, or 5 small meals each day in order to reach your protein and calorie goals.  Allow at least 15 minutes for each meal so that you can eat mindfully. Listen to your body so that you do not overeat. For most people, your sleeve or pouch will comfortably hold 4-6 ounces. Eat foods from all food groups. This includes fruits and vegetables, grains, dairy, and meat and other proteins. Include a protein-rich food at every meal and snack, and eat the protein food first.  You should be taking a Bariatric Multivitamin as well as calcium.    7. Anemia Nutrition: Iron-rich foods include dark leafy greens, red and white meats, eggs, seafood, and beans.  Certain foods and drinks prevent your body from absorbing iron properly. Avoid eating these foods in the same meal as iron-rich foods or with iron supplements. These foods include: coffee, black tea, and red wine; milk,  dairy products, and foods that are high in calcium; beans and soybeans; whole grains. Constipation can be a side effect of iron supplementation. Increased water and fiber intake are helpful. Water goal: > 2 liters/day. Fiber goal: > 25 grams/day.  8. Snores Abigail Duran endorses snoring and morning headaches.  Epworth sleepiness score is 3.  9. GAD (generalized anxiety disorder), with emotional eating Not at goal. Medication: None.  Abigail Duran eats when stressed, when sad, when bored, and to comfort herself.  Plan:  Start phentermine 18.75 mg daily and Trokendi 25 mg daily, as per below.  Patient was referred to Dr. Dewaine Conger, our Bariatric Psychologist, for evaluation due to her elevated PHQ-9 score and significant struggles with emotional eating.  - Start phentermine (ADIPEX-P) 37.5 MG tablet; Take 0.5 tablets (18.75 mg total) by mouth daily before breakfast.  Dispense: 15 tablet; Refill: 0 - Start Topiramate ER (TROKENDI XR) 25 MG CP24; Take 1 capsule (25 mg total) by mouth daily at 12 noon.  Dispense: 30 capsule; Refill: 0  10. Depression screening Abigail Duran was screened for depression as part of her new patient workup today.  PHQ-9 is 11.  Abigail Duran had a positive depression screening.  Depression is commonly associated with obesity and often results in emotional eating behaviors. We will monitor this closely and work on CBT to help improve the non-hunger eating patterns. Referral to Psychology may be required if no improvement is seen as she continues in our clinic.  11. At risk for heart disease Due to Abigail Duran's current state of health and medical condition(s), she is at a higher risk for heart disease.  This puts the patient at much greater risk to subsequently develop cardiopulmonary conditions that can significantly affect patient's quality of life in a negative manner.    At least 8 minutes were spent on counseling Abigail Duran about these concerns today. Evidence-based interventions for health behavior change were  utilized today including the discussion of self monitoring techniques, problem-solving barriers, and SMART goal setting techniques.  Specifically, regarding patient's less desirable eating habits and patterns, we employed the technique of small changes when Abigail Duran has not been able to fully commit to her prudent nutritional plan.  12. Class 2 severe obesity with serious comorbidity and body mass index (BMI) of 38.0 to 38.9 in adult, unspecified obesity type (HCC)  Abigail Duran is currently in the action stage of change and her goal is to continue with weight loss efforts. I recommend Abigail Duran begin the structured treatment plan as follows:  She has agreed to practicing portion control and making smarter food choices, such as increasing vegetables and decreasing simple carbohydrates.  Exercise goals:  As is.    Behavioral modification strategies: increasing lean protein intake, decreasing simple carbohydrates, increasing vegetables, increasing water intake, and emotional eating strategies.  She was informed of the importance of frequent follow-up visits to maximize her success with intensive lifestyle modifications for her multiple health conditions. She was informed we would discuss her lab results at her next visit unless there is a critical issue that needs to be addressed sooner. Abigail Duran agreed to keep her next visit at the agreed upon time to discuss these results.  Objective:   Blood pressure (!) 134/91, pulse 83, temperature 98.1 F (36.7 C), temperature source Oral, height 5\' 3"  (1.6 m), weight 217 lb (98.4 kg), SpO2 99 %. Body mass index is 38.44 kg/m.  EKG: Normal sinus rhythm, rate 82 bpm.  Indirect Calorimeter completed today shows a VO2 of 235 and a REE of 1627.  Her calculated basal metabolic rate is thus her basal metabolic rate is worse than expected.  General: Cooperative, alert, well developed, in no acute distress. HEENT: Conjunctivae and lids unremarkable. Cardiovascular: Regular  rhythm.  Lungs: Normal work of breathing. Neurologic: No focal deficits.   Lab Results  Component Value Date   CREATININE 0.84 01/30/2021   BUN 13 01/30/2021   NA 139 01/30/2021   K 4.5 01/30/2021   CL 103 01/30/2021   CO2 29 01/30/2021   Lab Results  Component Value Date   ALT 8 01/30/2021   AST 15 01/30/2021   ALKPHOS 65 06/01/2017   BILITOT 0.3 01/30/2021   Lab Results  Component Value Date   HGBA1C 5.8 (H) 01/30/2021   HGBA1C 5.9 (H) 04/09/2020   HGBA1C 5.5 01/24/2020   HGBA1C 5.7 (H) 03/01/2019   HGBA1C 5.6 09/27/2017   Lab Results  Component Value Date   TSH 1.92 01/30/2021   Lab Results  Component Value Date   CHOL 200 (H) 01/30/2021   HDL 63 01/30/2021   LDLCALC 118 (H) 01/30/2021   TRIG 88 01/30/2021   CHOLHDL 3.2 01/30/2021   Lab Results  Component Value  Date   VD25OH 21 (L) 01/30/2021   VD25OH 40 01/24/2020   VD25OH 14 (L) 03/01/2019   Lab Results  Component Value Date   WBC 3.8 01/30/2021   HGB 11.6 (L) 01/30/2021   HCT 37.3 01/30/2021   MCV 85.9 01/30/2021   PLT 256 01/30/2021   Lab Results  Component Value Date   IRON 86 01/24/2020   TIBC CANCELED 01/24/2020   FERRITIN 58 01/24/2020   Attestation Statements:   This is the patient's first visit at [facility]. The patient's NEW PATIENT PACKET was reviewed at length. Included in the packet: current and past health history, medications, allergies, ROS, gynecologic history (women only), surgical history, family history, social history, weight history, weight loss surgery history (for those that have had weight loss surgery), nutritional evaluation, mood and food questionnaire, PHQ9, Epworth questionnaire, sleep habits questionnaire, patient life and health improvement goals questionnaire. These will all be scanned into the patient's chart under media.   During the visit, I independently reviewed the patient's EKG, bioimpedance scale results, and indirect calorimeter results. I  used this information to tailor a meal plan for the patient that will help her to lose weight and will improve her obesity-related conditions going forward. I performed a medically necessary appropriate examination and/or evaluation. I discussed the assessment and treatment plan with the patient. The patient was provided an opportunity to ask questions and all were answered. The patient agreed with the plan and demonstrated an understanding of the instructions. Labs were ordered at this visit and will be reviewed at the next visit unless more critical results need to be addressed immediately. Clinical information was updated and documented in the EMR.   I, Insurance claims handler, CMA, am acting as transcriptionist for Helane Rima, DO  I have reviewed the above documentation for accuracy and completeness, and I agree with the above. Helane Rima, DO

## 2021-03-06 ENCOUNTER — Encounter: Payer: Self-pay | Admitting: Nurse Practitioner

## 2021-03-06 ENCOUNTER — Ambulatory Visit (INDEPENDENT_AMBULATORY_CARE_PROVIDER_SITE_OTHER): Payer: No Typology Code available for payment source | Admitting: Nurse Practitioner

## 2021-03-06 ENCOUNTER — Other Ambulatory Visit: Payer: Self-pay

## 2021-03-06 VITALS — BP 124/78 | HR 90 | Temp 98.7°F | Resp 14 | Ht 63.0 in | Wt 220.0 lb

## 2021-03-06 DIAGNOSIS — R030 Elevated blood-pressure reading, without diagnosis of hypertension: Secondary | ICD-10-CM

## 2021-03-06 DIAGNOSIS — D649 Anemia, unspecified: Secondary | ICD-10-CM | POA: Diagnosis not present

## 2021-03-06 DIAGNOSIS — R059 Cough, unspecified: Secondary | ICD-10-CM | POA: Diagnosis not present

## 2021-03-06 DIAGNOSIS — F418 Other specified anxiety disorders: Secondary | ICD-10-CM | POA: Diagnosis not present

## 2021-03-06 LAB — CBC WITH DIFFERENTIAL/PLATELET
Absolute Monocytes: 553 cells/uL (ref 200–950)
Basophils Absolute: 39 cells/uL (ref 0–200)
Basophils Relative: 1.1 %
Eosinophils Absolute: 60 cells/uL (ref 15–500)
Eosinophils Relative: 1.7 %
HCT: 37.6 % (ref 35.0–45.0)
Hemoglobin: 12 g/dL (ref 11.7–15.5)
Lymphs Abs: 1516 cells/uL (ref 850–3900)
MCH: 27.5 pg (ref 27.0–33.0)
MCHC: 31.9 g/dL — ABNORMAL LOW (ref 32.0–36.0)
MCV: 86.2 fL (ref 80.0–100.0)
MPV: 11.5 fL (ref 7.5–12.5)
Monocytes Relative: 15.8 %
Neutro Abs: 1334 cells/uL — ABNORMAL LOW (ref 1500–7800)
Neutrophils Relative %: 38.1 %
Platelets: 233 10*3/uL (ref 140–400)
RBC: 4.36 10*6/uL (ref 3.80–5.10)
RDW: 14 % (ref 11.0–15.0)
Total Lymphocyte: 43.3 %
WBC: 3.5 10*3/uL — ABNORMAL LOW (ref 3.8–10.8)

## 2021-03-06 MED ORDER — FLUTICASONE PROPIONATE 50 MCG/ACT NA SUSP: 2.0000 | Freq: Every day | NASAL | 6 refills | Status: DC

## 2021-03-06 NOTE — Assessment & Plan Note (Signed)
Acute.  Will check CBC again today.  Etiologies include heavy menses vs. Iron absorption issue with history of bariatric surgery.  Consider iron supplementation if she remains anemic.  Follow up pending results.

## 2021-03-06 NOTE — Assessment & Plan Note (Signed)
Chronic, improved.  Will discontinue use of Lexapro.  She can still use hydroxyzine as needed for panic or to help with sleep.  Continue collaboration with counselor.  Follow up if she desires further medical treatment.

## 2021-03-06 NOTE — Assessment & Plan Note (Signed)
Blood pressure is much improved today, suspect related to improved anxiety and depression.  Follow up as needed.

## 2021-03-11 ENCOUNTER — Encounter: Payer: Self-pay | Admitting: Nurse Practitioner

## 2021-03-11 DIAGNOSIS — R059 Cough, unspecified: Secondary | ICD-10-CM

## 2021-03-17 ENCOUNTER — Telehealth (INDEPENDENT_AMBULATORY_CARE_PROVIDER_SITE_OTHER): Payer: No Typology Code available for payment source | Admitting: Psychology

## 2021-03-17 DIAGNOSIS — F411 Generalized anxiety disorder: Secondary | ICD-10-CM | POA: Diagnosis not present

## 2021-03-17 DIAGNOSIS — F3289 Other specified depressive episodes: Secondary | ICD-10-CM

## 2021-03-18 ENCOUNTER — Other Ambulatory Visit: Payer: Self-pay

## 2021-03-18 ENCOUNTER — Encounter (INDEPENDENT_AMBULATORY_CARE_PROVIDER_SITE_OTHER): Payer: Self-pay | Admitting: Family Medicine

## 2021-03-18 ENCOUNTER — Ambulatory Visit (INDEPENDENT_AMBULATORY_CARE_PROVIDER_SITE_OTHER): Payer: No Typology Code available for payment source | Admitting: Family Medicine

## 2021-03-18 VITALS — BP 118/78 | HR 92 | Temp 98.5°F | Ht 63.0 in | Wt 217.0 lb

## 2021-03-18 DIAGNOSIS — E282 Polycystic ovarian syndrome: Secondary | ICD-10-CM | POA: Diagnosis not present

## 2021-03-18 DIAGNOSIS — F411 Generalized anxiety disorder: Secondary | ICD-10-CM

## 2021-03-18 DIAGNOSIS — Z9189 Other specified personal risk factors, not elsewhere classified: Secondary | ICD-10-CM | POA: Diagnosis not present

## 2021-03-18 DIAGNOSIS — R632 Polyphagia: Secondary | ICD-10-CM

## 2021-03-18 DIAGNOSIS — Z6838 Body mass index (BMI) 38.0-38.9, adult: Secondary | ICD-10-CM

## 2021-03-19 MED ORDER — TROKENDI XR 25 MG PO CP24: 25.0000 mg | ORAL_CAPSULE | Freq: Every day | ORAL | 0 refills | Status: DC

## 2021-03-19 MED ORDER — PHENTERMINE HCL 37.5 MG PO TABS: 18.7500 mg | ORAL_TABLET | Freq: Every day | ORAL | 0 refills | Status: DC

## 2021-03-19 NOTE — Progress Notes (Signed)
Chief Complaint:   OBESITY Abigail Duran is here to discuss her progress with her obesity treatment plan along with follow-up of her obesity related diagnoses.   Today's visit was #: 2 Starting weight: 217 lbs Starting date: 03/04/2021 Today's weight: 217 lbs Today's date: 03/18/2021 Weight change since last visit: 0 Total lbs lost to date: 0 Body mass index is 38.44 kg/m.   Current Meal Plan: practicing portion control and making smarter food choices, such as increasing vegetables and decreasing simple carbohydrates for 25% of the time.  Current Exercise Plan: Increased walking. Current Anti-Obesity Medications: phentermine 18.75 mg daily.  Side effects: None.  Interim History:  Today's bioimpedance results indicate that Abigail Duran has gained 3 pounds of water weight since her last visit.    Abigail Duran provided the following food recall today: Breakfast:  Coffee. Snack:  Yogurt. Lunch:  Grilled chicken. Dinner:  Biomedical engineer.  Moderate water intake. Night eating is improving.  Assessment/Plan:   1. Polyphagia Improving, but not optimized. Current treatment: phentermine 18.75 mg daily. She will continue to focus on protein-rich, low simple carbohydrate foods. We reviewed the importance of hydration, regular exercise for stress reduction, and restorative sleep.  Plan:  Refill phentermine today at current dose.  - Refill phentermine (ADIPEX-P) 37.5 MG tablet; Take 0.5 tablets (18.75 mg total) by mouth daily before breakfast.  Dispense: 15 tablet; Refill: 0  Having again reminded the patient of the "off label" use of Phentermine beyond three consecutive months, and again discussing the risks, benefits, contraindications, and limitations of it's use; given it's role in the successful treatment of obesity thus far and lack of adverse effect, patient has expressed desire and given informed verbal consent to continue use.   I have consulted the [place] Controlled Substances Registry for this  patient, and feel the risk/benefit ratio today is favorable for proceeding with this prescription for a controlled substance. The patient understands monitoring parameters and red flags.   2. PCOS (polycystic ovarian syndrome) She will continue to focus on protein-rich, low simple carbohydrate foods. We reviewed the importance of hydration, regular exercise for stress reduction, and restorative sleep. Medication: None.  3. GAD (generalized anxiety disorder), with emotional eating Improving, but not optimized. Medication: Trokendi 25 mg daily.  Plan:  Continue Trokendi.  Will refill today.  Discussed cues and consequences, how thoughts affect eating, model of thoughts, feelings, and behaviors, and strategies for change by focusing on the cue. Discussed cognitive distortions, coping thoughts, and how to change your thoughts.  - Refill Topiramate ER (TROKENDI XR) 25 MG CP24; Take 1 capsule (25 mg total) by mouth daily at 12 noon.  Dispense: 30 capsule; Refill: 0  4. At risk for heart disease Due to Osie's current state of health and medical condition(s), she is at a higher risk for heart disease.  This puts the patient at much greater risk to subsequently develop cardiopulmonary conditions that can significantly affect patient's quality of life in a negative manner.    At least 8 minutes were spent on counseling Abigail Duran about these concerns today. Evidence-based interventions for health behavior change were utilized today including the discussion of self monitoring techniques, problem-solving barriers, and SMART goal setting techniques.  Specifically, regarding patient's less desirable eating habits and patterns, we employed the technique of small changes when Abigail Duran has not been able to fully commit to her prudent nutritional plan.  5. Obesity, current BMI 38.6  Course: Abigail Duran is currently in the action stage of change. As such,  her goal is to continue with weight loss efforts.   Nutrition goals: She  has agreed to practicing portion control and making smarter food choices, such as increasing vegetables and decreasing simple carbohydrates.   Exercise goals:  As is.  Behavioral modification strategies: increasing lean protein intake, decreasing simple carbohydrates, increasing vegetables, and increasing water intake.  Abigail Duran has agreed to follow-up with our clinic in 2-3 weeks. She was informed of the importance of frequent follow-up visits to maximize her success with intensive lifestyle modifications for her multiple health conditions.   Objective:   Blood pressure 118/78, pulse 92, temperature 98.5 F (36.9 C), temperature source Oral, height 5\' 3"  (1.6 m), weight 217 lb (98.4 kg), SpO2 100 %. Body mass index is 38.44 kg/m.  General: Cooperative, alert, well developed, in no acute distress. HEENT: Conjunctivae and lids unremarkable. Cardiovascular: Regular rhythm.  Lungs: Normal work of breathing. Neurologic: No focal deficits.   Lab Results  Component Value Date   CREATININE 0.84 01/30/2021   BUN 13 01/30/2021   NA 139 01/30/2021   K 4.5 01/30/2021   CL 103 01/30/2021   CO2 29 01/30/2021   Lab Results  Component Value Date   ALT 8 01/30/2021   AST 15 01/30/2021   ALKPHOS 65 06/01/2017   BILITOT 0.3 01/30/2021   Lab Results  Component Value Date   HGBA1C 5.8 (H) 01/30/2021   HGBA1C 5.9 (H) 04/09/2020   HGBA1C 5.5 01/24/2020   HGBA1C 5.7 (H) 03/01/2019   HGBA1C 5.6 09/27/2017   Lab Results  Component Value Date   TSH 1.92 01/30/2021   Lab Results  Component Value Date   CHOL 200 (H) 01/30/2021   HDL 63 01/30/2021   LDLCALC 118 (H) 01/30/2021   TRIG 88 01/30/2021   CHOLHDL 3.2 01/30/2021   Lab Results  Component Value Date   VD25OH 21 (L) 01/30/2021   VD25OH 40 01/24/2020   VD25OH 14 (L) 03/01/2019   Lab Results  Component Value Date   WBC 3.5 (L) 03/06/2021   HGB 12.0 03/06/2021   HCT 37.6 03/06/2021   MCV 86.2 03/06/2021   PLT 233 03/06/2021    Lab Results  Component Value Date   IRON 86 01/24/2020   TIBC CANCELED 01/24/2020   FERRITIN 58 01/24/2020   Attestation Statements:   Reviewed by clinician on day of visit: allergies, medications, problem list, medical history, surgical history, family history, social history, and previous encounter notes.  I, 01/26/2020, CMA, am acting as transcriptionist for Insurance claims handler, DO  I have reviewed the above documentation for accuracy and completeness, and I agree with the above. Helane Rima, DO

## 2021-03-20 ENCOUNTER — Encounter: Payer: Self-pay | Admitting: Nurse Practitioner

## 2021-03-20 ENCOUNTER — Other Ambulatory Visit: Payer: Self-pay

## 2021-03-20 ENCOUNTER — Telehealth (INDEPENDENT_AMBULATORY_CARE_PROVIDER_SITE_OTHER): Payer: No Typology Code available for payment source | Admitting: Nurse Practitioner

## 2021-03-20 DIAGNOSIS — R058 Other specified cough: Secondary | ICD-10-CM | POA: Diagnosis not present

## 2021-03-20 MED ORDER — BENZONATATE 200 MG PO CAPS: 200.0000 mg | ORAL_CAPSULE | Freq: Two times a day (BID) | ORAL | 0 refills | Status: DC | PRN

## 2021-03-20 NOTE — Progress Notes (Signed)
Subjective:    Patient ID: Abigail Duran, female    DOB: [birth year], 45 y.o.   MRN: [id number]  HPI: Abigail Duran is a 45 y.o. female presenting virtually for persistent cough.   Chief Complaint  Patient presents with   Follow-up   UPPER RESPIRATORY TRACT INFECTION Onset: ~3 weeks ago COVID-19 testing history: negative Fever: no Cough: yes; dry Shortness of breath: no Pleurisy: no Wheezing: no Chest pain: no Chest tightness: no Chest congestion: no Nasal congestion: no Runny nose: no Post nasal drip: no Sneezing: no Sore throat: no Swollen glands: no Sinus pressure: no Headache: no Nausea: no  Vomiting: no Diarrhea: no  Change in appetite: no  Loss of taste/smell: no  Rash: no Fatigue: no Sick contacts: no Strep contacts: no  Context: better Recurrent sinusitis: no Treatments attempted:  flonase Relief with OTC medications: some  Allergies  Allergen Reactions   Codeine Nausea And Vomiting   Elemental Sulfur Nausea Only    Outpatient Encounter Medications as of 03/20/2021  Medication Sig   benzonatate (TESSALON) 200 MG capsule Take 1 capsule (200 mg total) by mouth 2 (two) times daily as needed for cough. Take first dose at night time and monitor for drowsiness.  If this medication makes you drowsy, do not take while driving or operating heavy machinery.   phentermine (ADIPEX-P) 37.5 MG tablet Take 0.5 tablets (18.75 mg total) by mouth daily before breakfast.   Topiramate ER (TROKENDI XR) 25 MG CP24 Take 1 capsule (25 mg total) by mouth daily at 12 noon.   No facility-administered encounter medications on file as of 03/20/2021.    Patient Active Problem List   Diagnosis Date Noted   Anemia 03/06/2021   Vitamin D deficiency 03/04/2021   PCOS (polycystic ovarian syndrome) 03/04/2021   Pre-diabetes 04/10/2020   Depression with anxiety 04/09/2020   History of bariatric surgery 03/01/2019   History of abnormal cervical Pap smear 08/28/2014   Elevated  BP without diagnosis of hypertension 08/28/2014    Past Medical History:  Diagnosis Date   Anemia    Anxiety    Back pain    CIN I (cervical intraepithelial neoplasia I) 07/30/2010   Headache    History of PCOS 2008   History of varicella    Irregular periods/menstrual cycles 12/14/2006   LGSIL (low grade squamous intraepithelial lesion) on Pap smear 08/13/2009   colpo   Obesity    Pre-diabetes    Vitamin D deficiency    Wears glasses    Yeast infection    hx/o    Relevant past medical, surgical, family and social history reviewed and updated as indicated. Interim medical history since our last visit reviewed.  Review of Systems Per HPI unless specifically indicated above     Objective:    There were no vitals taken for this visit.  Wt Readings from Last 3 Encounters:  03/18/21 217 lb (98.4 kg)  03/06/21 220 lb (99.8 kg)  03/04/21 217 lb (98.4 kg)    Physical Exam Vitals and nursing note reviewed.  Constitutional:      General: She is not in acute distress.    Appearance: Normal appearance. She is not toxic-appearing.  HENT:     Right Ear: External ear normal.     Left Ear: External ear normal.     Mouth/Throat:     Mouth: Mucous membranes are moist.     Pharynx: Oropharynx is clear.  Eyes:     General: No scleral icterus.  Extraocular Movements: Extraocular movements intact.  Pulmonary:     Effort: No respiratory distress.     Comments: Unable to assess breath sounds via virtual visit.  Patient talking in complete sentences during telemedicine visit with occasional dry cough. Skin:    Coloration: Skin is not jaundiced or pale.     Findings: No erythema.  Neurological:     Mental Status: She is alert and oriented to person, place, and time.  Psychiatric:        Mood and Affect: Mood normal.        Behavior: Behavior normal.        Thought Content: Thought content normal.        Judgment: Judgment normal.       Assessment & Plan:  1. Post-viral  cough syndrome Acute.  No other viral symptoms, no red flags.  Discussed that post-viral cough may last for up to 6 weeks after initial infection.  Start cough suppressant at night time.  Follow up if cough still present after treatment.   - benzonatate (TESSALON) 200 MG capsule; Take 1 capsule (200 mg total) by mouth 2 (two) times daily as needed for cough. Take first dose at night time and monitor for drowsiness.  If this medication makes you drowsy, do not take while driving or operating heavy machinery.  Dispense: 20 capsule; Refill: 0  Follow up plan: Return if symptoms worsen or fail to improve.  Due to the catastrophic nature of the COVID-19 pandemic, this video visit was completed soley via audio and visual contact via Caregility due to the restrictions of the COVID-19 pandemic.  All issues as above were discussed and addressed. Physical exam was done as above through visual confirmation on Caregility. If it was felt that the patient should be evaluated in the office, they were directed there. The patient verbally consented to this visit. Location of the patient: parking lot Location of the provider: work Those involved with this call:  Provider: Cathlean Marseilles, DNP, FNP-C CMA: Alm Bustard, LPN Front Desk/Registration: Percival Spanish  Time spent on call:  8 minutes with patient face to face via video conference. More than 50% of this time was spent in counseling and coordination of care. 12 minutes total spent in review of patient's record and preparation of their chart. I verified patient identity using two factors (patient name and date of birth). Patient consents verbally to being seen via telemedicine visit today.

## 2021-03-24 ENCOUNTER — Encounter (INDEPENDENT_AMBULATORY_CARE_PROVIDER_SITE_OTHER): Payer: Self-pay | Admitting: Family Medicine

## 2021-03-25 NOTE — Progress Notes (Unsigned)
  Office: [phone]  /  Fax: [phone]    Date: April 08, 2021   Appointment Start Time: *** Duration: *** minutes Provider: Lawerance Cruel, Psy.D. Type of Session: Individual Therapy  Location of Patient: {gbptloc:23249} Location of Provider: Provider's Home (private office) Type of Contact: Telepsychological Visit via MyChart Video Visit  Session Content:This provider called Abigail Duran at 4:10pm as she did not present for today's appointment. *** As such, today's appointment was initiated *** minutes late.  Abigail Duran is a 45 y.o. female presenting for a follow-up appointment to address the previously established treatment goal of increasing coping skills.Today's appointment was a telepsychological visit due to COVID-19. Abigail Duran provided verbal consent for today's telepsychological appointment and she is aware she is responsible for securing confidentiality on her end of the session. Prior to proceeding with today's appointment, Abigail Duran's physical location at the time of this appointment was obtained as well a phone number she could be reached at in the event of technical difficulties. Abigail Duran and this provider participated in today's telepsychological service.   This provider conducted a brief check-in. *** Abigail Duran was receptive to today's appointment as evidenced by openness to sharing, responsiveness to feedback, and {gbreceptiveness:23401}.  Mental Status Examination:  Appearance: {Appearance:22431} Behavior: {Behavior:22445} Mood: {gbmood:21757} Affect: {Affect:22436} Speech: {Speech:22432} Eye Contact: {Eye Contact:22433} Psychomotor Activity: {Motor Activity:22434} Gait: {gbgait:23404} Thought Process: {thought process:22448}  Thought Content/Perception: {disturbances:22451} Orientation: {Orientation:22437} Memory/Concentration: {gbcognition:22449} Insight/Judgment: {Insight:22446}  Interventions:  {Interventions for Progress Notes:23405}  DSM-5 Diagnosis(es): F32.89 Other Specified  Depressive Disorder, Emotional Eating Behaviors and F41.1 Generalized Anxiety Disorder  Treatment Goal & Progress: During the initial appointment with this provider, the following treatment goal was established: increase coping skills. Abigail Duran has demonstrated progress in her goal as evidenced by {gbtxprogress:22839}. Abigail Duran also {gbtxprogress2:22951}.  Plan: The next appointment will be scheduled in {gbweeks:21758}, which will be {gbtxmodality:23402}. The next session will focus on {Plan for Next Appointment:23400}.

## 2021-04-03 ENCOUNTER — Telehealth (INDEPENDENT_AMBULATORY_CARE_PROVIDER_SITE_OTHER): Payer: Self-pay

## 2021-04-03 ENCOUNTER — Other Ambulatory Visit (INDEPENDENT_AMBULATORY_CARE_PROVIDER_SITE_OTHER): Payer: Self-pay | Admitting: Family Medicine

## 2021-04-03 DIAGNOSIS — R632 Polyphagia: Secondary | ICD-10-CM

## 2021-04-03 NOTE — Telephone Encounter (Signed)
Pt was calling she stated that she couldn't hear who left her a message. She was calling in about her Phentermine refill. Please advise

## 2021-04-03 NOTE — Telephone Encounter (Signed)
Spoke with the patient and informed her that the pharmacy had her prescription on hold that was sent in on 9/21 but is getting it ready for her to pick up. Pt verbalized understanding. Katiya Fike, CMA

## 2021-04-08 ENCOUNTER — Telehealth (INDEPENDENT_AMBULATORY_CARE_PROVIDER_SITE_OTHER): Payer: No Typology Code available for payment source | Admitting: Psychology

## 2021-04-08 ENCOUNTER — Telehealth (INDEPENDENT_AMBULATORY_CARE_PROVIDER_SITE_OTHER): Payer: Self-pay | Admitting: Psychology

## 2021-04-08 NOTE — Telephone Encounter (Signed)
  Office: [phone]  /  Fax: [phone]  Date of Call: April 08, 2021  Time of Call: 4:10pm Provider: Lawerance Cruel, PsyD  CONTENT: This provider called Abigail Duran to check-in as she did not present for today's MyChart Video Visit appointment at 4:00pm. A HIPAA compliant voicemail was left requesting a call back.   PLAN: This provider will wait for [name] to call back. No further follow-up planned by this provider.

## 2021-04-09 ENCOUNTER — Ambulatory Visit (INDEPENDENT_AMBULATORY_CARE_PROVIDER_SITE_OTHER): Payer: No Typology Code available for payment source | Admitting: Family Medicine

## 2021-05-05 ENCOUNTER — Other Ambulatory Visit (INDEPENDENT_AMBULATORY_CARE_PROVIDER_SITE_OTHER): Payer: Self-pay | Admitting: Family Medicine

## 2021-05-05 DIAGNOSIS — R632 Polyphagia: Secondary | ICD-10-CM

## 2021-05-05 NOTE — Telephone Encounter (Signed)
Wallace

## 2021-05-07 ENCOUNTER — Other Ambulatory Visit: Payer: Self-pay

## 2021-05-07 ENCOUNTER — Encounter (INDEPENDENT_AMBULATORY_CARE_PROVIDER_SITE_OTHER): Payer: Self-pay | Admitting: Family Medicine

## 2021-05-07 ENCOUNTER — Ambulatory Visit (INDEPENDENT_AMBULATORY_CARE_PROVIDER_SITE_OTHER): Payer: No Typology Code available for payment source | Admitting: Family Medicine

## 2021-05-07 VITALS — BP 126/78 | HR 93 | Temp 98.0°F | Ht 63.0 in | Wt 208.0 lb

## 2021-05-07 DIAGNOSIS — Z6838 Body mass index (BMI) 38.0-38.9, adult: Secondary | ICD-10-CM | POA: Diagnosis not present

## 2021-05-07 DIAGNOSIS — F411 Generalized anxiety disorder: Secondary | ICD-10-CM | POA: Diagnosis not present

## 2021-05-07 DIAGNOSIS — R632 Polyphagia: Secondary | ICD-10-CM | POA: Diagnosis not present

## 2021-05-08 MED ORDER — PHENTERMINE HCL 37.5 MG PO TABS: 37.5000 mg | ORAL_TABLET | Freq: Every day | ORAL | 0 refills | Status: DC

## 2021-05-08 MED ORDER — TROKENDI XR 25 MG PO CP24: 25.0000 mg | ORAL_CAPSULE | Freq: Every day | ORAL | 0 refills | Status: DC

## 2021-05-08 NOTE — Progress Notes (Signed)
Chief Complaint:   OBESITY Abigail Duran is here to discuss her progress with her obesity treatment plan along with follow-up of her obesity related diagnoses. See Medical Weight Management Flowsheet for complete bioelectrical impedance results.  Today's visit was #: 3 Starting weight: 217 lbs Starting date: 03/04/2021 Weight change since last visit: 9 lbs Total lbs lost to date: 9 lbs Total weight loss percentage to date: -4.15%  Nutrition Plan: Practicing portion control and making smarter food choices, such as increasing vegetables and decreasing simple carbohydrates for 80% of the time. Activity: Walking for 45 minutes 3 times per week.  Anti-obesity medications: phentermine 18.75 mg daily. Reported side effects: None.  Interim History: Quenisha says that with taking half of a phentermine tablet, she crashes at noon.  Taking a whole tablet works well along with Trokendi.  She went through a breakup since our last visit.  She is happy about her progress!  Assessment/Plan:   1. Polyphagia Not at goal. Current treatment: phentermine 18.75 mg daily.    Plan:  Increase phentermine to 1 whole 37.5 mg tablet daily.  She will continue to focus on protein-rich, low simple carbohydrate foods. We reviewed the importance of hydration, regular exercise for stress reduction, and restorative sleep.  - Refill phentermine (ADIPEX-P) 37.5 MG tablet; Take 1 tablet (37.5 mg total) by mouth daily before breakfast.  Dispense: 30 tablet; Refill: 0  Having again reminded the patient of the "off label" use of Phentermine beyond three consecutive months, and again discussing the risks, benefits, contraindications, and limitations of it's use; given it's role in the successful treatment of obesity thus far and lack of adverse effect, patient has expressed desire and given informed verbal consent to continue use.   I have consulted the [place] Controlled Substances Registry for this patient, and feel the risk/benefit  ratio today is favorable for proceeding with this prescription for a controlled substance. The patient understands monitoring parameters and red flags.   2. GAD (generalized anxiety disorder), with emotional eating Improving, but not optimized. Medication: Trokendi 25 mg daily.   Plan:  Continue Trokendi.  Will refill today.  Discussed cues and consequences, how thoughts affect eating, model of thoughts, feelings, and behaviors, and strategies for change by focusing on the cue. Discussed cognitive distortions, coping thoughts, and how to change your thoughts.  - Refill Topiramate ER (TROKENDI XR) 25 MG CP24; Take 1 capsule (25 mg total) by mouth daily at 12 noon.  Dispense: 30 capsule; Refill: 0  3. Obesity, current BMI 38.9  Course: [name] is currently in the action stage of change. As such, her goal is to continue with weight loss efforts.   Nutrition goals: She has agreed to practicing portion control and making smarter food choices, such as increasing vegetables and decreasing simple carbohydrates.   Exercise goals:  As is.  Behavioral modification strategies: increasing lean protein intake, decreasing simple carbohydrates, increasing vegetables, increasing water intake, and emotional eating strategies.  [name] has agreed to follow-up with our clinic in 4 weeks. She was informed of the importance of frequent follow-up visits to maximize her success with intensive lifestyle modifications for her multiple health conditions.   Objective:   Blood pressure 126/78, pulse 93, temperature 98 F (36.7 C), temperature source Oral, height 5\' 3"  (1.6 m), weight 208 lb (94.3 kg), SpO2 99 %. Body mass index is 36.85 kg/m.  General: Cooperative, alert, well developed, in no acute distress. HEENT: Conjunctivae and lids unremarkable. Cardiovascular: Regular rhythm.  Lungs: Normal  work of breathing. Neurologic: No focal deficits.   Lab Results  Component Value Date   CREATININE 0.84 01/30/2021    BUN 13 01/30/2021   NA 139 01/30/2021   K 4.5 01/30/2021   CL 103 01/30/2021   CO2 29 01/30/2021   Lab Results  Component Value Date   ALT 8 01/30/2021   AST 15 01/30/2021   ALKPHOS 65 06/01/2017   BILITOT 0.3 01/30/2021   Lab Results  Component Value Date   HGBA1C 5.8 (H) 01/30/2021   HGBA1C 5.9 (H) 04/09/2020   HGBA1C 5.5 01/24/2020   HGBA1C 5.7 (H) 03/01/2019   HGBA1C 5.6 09/27/2017   Lab Results  Component Value Date   TSH 1.92 01/30/2021   Lab Results  Component Value Date   CHOL 200 (H) 01/30/2021   HDL 63 01/30/2021   LDLCALC 118 (H) 01/30/2021   TRIG 88 01/30/2021   CHOLHDL 3.2 01/30/2021   Lab Results  Component Value Date   VD25OH 21 (L) 01/30/2021   VD25OH 40 01/24/2020   VD25OH 14 (L) 03/01/2019   Lab Results  Component Value Date   WBC 3.5 (L) 03/06/2021   HGB 12.0 03/06/2021   HCT 37.6 03/06/2021   MCV 86.2 03/06/2021   PLT 233 03/06/2021   Lab Results  Component Value Date   IRON 86 01/24/2020   TIBC CANCELED 01/24/2020   FERRITIN 58 01/24/2020   Attestation Statements:   Reviewed by clinician on day of visit: allergies, medications, problem list, medical history, surgical history, family history, social history, and previous encounter notes.  I, Insurance claims handler, CMA, am acting as transcriptionist for Helane Rima, DO  I have reviewed the above documentation for accuracy and completeness, and I agree with the above. -  Helane Rima, DO, MS, FAAFP, DABOM - Family and Bariatric Medicine.

## 2021-06-11 ENCOUNTER — Ambulatory Visit (INDEPENDENT_AMBULATORY_CARE_PROVIDER_SITE_OTHER): Payer: No Typology Code available for payment source | Admitting: Family Medicine

## 2021-06-18 ENCOUNTER — Other Ambulatory Visit (INDEPENDENT_AMBULATORY_CARE_PROVIDER_SITE_OTHER): Payer: Self-pay | Admitting: Family Medicine

## 2021-06-18 DIAGNOSIS — R632 Polyphagia: Secondary | ICD-10-CM

## 2021-06-19 ENCOUNTER — Encounter (INDEPENDENT_AMBULATORY_CARE_PROVIDER_SITE_OTHER): Payer: Self-pay

## 2021-06-19 DIAGNOSIS — R632 Polyphagia: Secondary | ICD-10-CM

## 2021-06-19 NOTE — Telephone Encounter (Signed)
LOV Dr. Earlene Plater

## 2021-06-19 NOTE — Telephone Encounter (Signed)
Msg sent to pt 

## 2021-06-24 NOTE — Telephone Encounter (Signed)
LAST APPOINTMENT DATE: 05/07/21 NEXT APPOINTMENT DATE: 07/09/21   [id number] - [place], [place] - [address] AT [facility] OF [address] [place] [address] Phone: [phone] Fax: [phone]  Patient is requesting a refill of the following medications: Requested Prescriptions   Pending Prescriptions Disp Refills   phentermine (ADIPEX-P) 37.5 MG tablet 30 tablet 0    Sig: Take 1 tablet (37.5 mg total) by mouth daily before breakfast.    Date last filled: 05/08/21 Previously prescribed by Earlene Plater  Lab Results  Component Value Date   HGBA1C 5.8 (H) 01/30/2021   HGBA1C 5.9 (H) 04/09/2020   HGBA1C 5.5 01/24/2020   Lab Results  Component Value Date   LDLCALC 118 (H) 01/30/2021   CREATININE 0.84 01/30/2021   Lab Results  Component Value Date   VD25OH 21 (L) 01/30/2021   VD25OH 40 01/24/2020   VD25OH 14 (L) 03/01/2019    BP Readings from Last 3 Encounters:  05/07/21 126/78  03/18/21 118/78  03/06/21 124/78

## 2021-06-26 MED ORDER — PHENTERMINE HCL 37.5 MG PO TABS: 37.5000 mg | ORAL_TABLET | Freq: Every day | ORAL | 0 refills | Status: DC

## 2021-06-26 NOTE — Addendum Note (Signed)
Addended by: Helane Rima R on: 06/26/2021 12:20 PM   Modules accepted: Orders

## 2021-07-09 ENCOUNTER — Ambulatory Visit (INDEPENDENT_AMBULATORY_CARE_PROVIDER_SITE_OTHER): Payer: No Typology Code available for payment source | Admitting: Family Medicine

## 2021-07-24 ENCOUNTER — Telehealth (INDEPENDENT_AMBULATORY_CARE_PROVIDER_SITE_OTHER): Payer: Self-pay

## 2021-07-24 ENCOUNTER — Encounter (INDEPENDENT_AMBULATORY_CARE_PROVIDER_SITE_OTHER): Payer: Self-pay | Admitting: Family Medicine

## 2021-07-24 ENCOUNTER — Other Ambulatory Visit: Payer: Self-pay

## 2021-07-24 ENCOUNTER — Ambulatory Visit (INDEPENDENT_AMBULATORY_CARE_PROVIDER_SITE_OTHER): Payer: No Typology Code available for payment source | Admitting: Family Medicine

## 2021-07-24 VITALS — BP 137/84 | HR 107 | Temp 98.1°F | Ht 63.0 in | Wt 208.0 lb

## 2021-07-24 DIAGNOSIS — E7800 Pure hypercholesterolemia, unspecified: Secondary | ICD-10-CM | POA: Diagnosis not present

## 2021-07-24 DIAGNOSIS — E559 Vitamin D deficiency, unspecified: Secondary | ICD-10-CM

## 2021-07-24 DIAGNOSIS — R7303 Prediabetes: Secondary | ICD-10-CM

## 2021-07-24 DIAGNOSIS — E669 Obesity, unspecified: Secondary | ICD-10-CM

## 2021-07-24 DIAGNOSIS — Z862 Personal history of diseases of the blood and blood-forming organs and certain disorders involving the immune mechanism: Secondary | ICD-10-CM

## 2021-07-24 DIAGNOSIS — R632 Polyphagia: Secondary | ICD-10-CM

## 2021-07-24 DIAGNOSIS — Z6838 Body mass index (BMI) 38.0-38.9, adult: Secondary | ICD-10-CM

## 2021-07-24 DIAGNOSIS — Z6836 Body mass index (BMI) 36.0-36.9, adult: Secondary | ICD-10-CM

## 2021-07-24 MED ORDER — SAXENDA 18 MG/3ML ~~LOC~~ SOPN
3.0000 mg | PEN_INJECTOR | Freq: Every day | SUBCUTANEOUS | 0 refills | Status: DC
Start: 2021-07-24 — End: 2021-08-21

## 2021-07-24 MED ORDER — PHENTERMINE HCL 37.5 MG PO TABS: 37.5000 mg | ORAL_TABLET | Freq: Every day | ORAL | 0 refills | Status: DC

## 2021-07-24 NOTE — Telephone Encounter (Signed)
Prior Auth Submitted for [employer] via cover my meds

## 2021-07-24 NOTE — Progress Notes (Signed)
Chief Complaint:   OBESITY Abigail Duran is here to discuss her progress with her obesity treatment plan along with follow-up of her obesity related diagnoses. See Medical Weight Management Flowsheet for complete bioelectrical impedance results.  Today's visit was #: 3 Starting weight: 217 lbs Starting date: 03/04/2021 Weight change since last visit: 0 Total lbs lost to date: 9 lbs Total weight loss percentage to date: -4.15%  Nutrition Plan: Practicing portion control and making smarter food choices, such as increasing vegetables and decreasing simple carbohydrates for 75% of the time. Activity: Walking/cardio for 30-45 minutes 2-3 times per week.  Anti-obesity medications: phentermine 37.5 mg daily. Reported side effects: None.  Interim History: Abigail Duran says she has been doing some nighttime eating again.  She is interested in a GLP-1.  She says that she felt GI upset with Ozempic and had word finding issues with topiramate.    Abigail Duran provided the following food recall today:  Breakfast:  Coffee:  [employer] Extra Extra creamer 2 tbsp. Lunch:  At work. Dinner:  Before 7 pm.  Assessment/Plan:   1. Polyphagia Not at goal. Current treatment: phentermine 18.75 mg daily.    Plan: Continue phentermine and start Saxenda 0.6 mg subcutaneously daily.  She will continue to focus on protein-rich, low simple carbohydrate foods. We reviewed the importance of hydration, regular exercise for stress reduction, and restorative sleep.  - Refill phentermine (ADIPEX-P) 37.5 MG tablet; Take 1 tablet (37.5 mg total) by mouth daily before breakfast.  Dispense: 30 tablet; Refill: 0 - Start Liraglutide -Weight Management (SAXENDA) 18 MG/3ML SOPN; Inject 3 mg into the skin daily. 0.6 mg daily x 1 week, then 1.2 mg daily x 1 weeks, then 1.8 mg daily x 1 week, then 2.4 mg daily x 1 week, then 3 mg daily.  Dispense: 3 mL; Refill: 0  Having again reminded the patient of the "off label" use of Phentermine beyond  three consecutive months, and again discussing the risks, benefits, contraindications, and limitations of it's use; given it's role in the successful treatment of obesity thus far and lack of adverse effect, patient has expressed desire and given informed verbal consent to continue use.   I have consulted the [place] Controlled Substances Registry for this patient, and feel the risk/benefit ratio today is favorable for proceeding with this prescription for a controlled substance. The patient understands monitoring parameters and red flags.   2. Prediabetes Not at goal. Goal is HgbA1c < 5.7.  Medication: None..    Plan: She will continue to focus on protein-rich, low simple carbohydrate foods. We reviewed the importance of hydration, regular exercise for stress reduction, and restorative sleep. Will check labs today.  - Comprehensive metabolic panel - Hemoglobin A1c  3. Pure hypercholesterolemia Course: Not at goal. Lipid-lowering medications: None.   Plan: Dietary changes: Increase soluble fiber, decrease simple carbohydrates, decrease saturated fat. Exercise changes: Moderate to vigorous-intensity aerobic activity 150 minutes per week or as tolerated. We will continue to monitor along with PCP/specialists as it pertains to her weight loss journey. Will check lipid panel today.  Lab Results  Component Value Date   CHOL 205 (H) 07/24/2021   HDL 59 07/24/2021   LDLCALC 131 (H) 07/24/2021   TRIG 84 07/24/2021   CHOLHDL 3.5 07/24/2021   Lab Results  Component Value Date   ALT 10 07/24/2021   AST 12 07/24/2021   ALKPHOS 72 07/24/2021   BILITOT <0.2 07/24/2021   The 10-year ASCVD risk score (Arnett DK, et al.,  2019) is: 1%   Values used to calculate the score:     Age: 46 years     Sex: Female     Is Non-Hispanic African American: Yes     Diabetic: No     Tobacco smoker: No     Systolic Blood Pressure: 137 mmHg     Is BP treated: No     HDL Cholesterol: 59 mg/dL     Total Cholesterol:  205 mg/dL  - Lipid panel  4. Vitamin D deficiency Not at goal.   Plan: Will check vitamin D level today, as per below.  Lab Results  Component Value Date   VD25OH 21 (L) 01/30/2021   VD25OH 40 01/24/2020   - VITAMIN D 25 Hydroxy (Vit-D Deficiency, Fractures)  5. History of anemia Will check anemia panel and CBC with diff today.  - Anemia panel - CBC with Differential/Platelet  6. Obesity, current BMI 36.9  Course: Abigail Duran is currently in the action stage of change. As such, her goal is to continue with weight loss efforts.   Nutrition goals: She has agreed to practicing portion control and making smarter food choices, such as increasing vegetables and decreasing simple carbohydrates.   Exercise goals:  As is.  Behavioral modification strategies: increasing lean protein intake, decreasing simple carbohydrates, increasing vegetables, and increasing water intake.  Abigail Duran has agreed to follow-up with our clinic in 4 weeks. She was informed of the importance of frequent follow-up visits to maximize her success with intensive lifestyle modifications for her multiple health conditions.   Objective:   Blood pressure 137/84, pulse (!) 107, temperature 98.1 F (36.7 C), temperature source Oral, height 5\' 3"  (1.6 m), weight 208 lb (94.3 kg), SpO2 97 %. Body mass index is 36.85 kg/m.  General: Cooperative, alert, well developed, in no acute distress. HEENT: Conjunctivae and lids unremarkable. Cardiovascular: Regular rhythm.  Lungs: Normal work of breathing. Neurologic: No focal deficits.   Lab Results  Component Value Date   CREATININE 0.84 01/30/2021   BUN 13 01/30/2021   NA 139 01/30/2021   K 4.5 01/30/2021   CL 103 01/30/2021   CO2 29 01/30/2021   Lab Results  Component Value Date   ALT 8 01/30/2021   AST 15 01/30/2021   ALKPHOS 65 06/01/2017   BILITOT 0.3 01/30/2021   Lab Results  Component Value Date   HGBA1C 5.8 (H) 01/30/2021   HGBA1C 5.9 (H) 04/09/2020    HGBA1C 5.5 01/24/2020   HGBA1C 5.7 (H) 03/01/2019   HGBA1C 5.6 09/27/2017   Lab Results  Component Value Date   TSH 1.92 01/30/2021   Lab Results  Component Value Date   CHOL 200 (H) 01/30/2021   HDL 63 01/30/2021   LDLCALC 118 (H) 01/30/2021   TRIG 88 01/30/2021   CHOLHDL 3.2 01/30/2021   Lab Results  Component Value Date   VD25OH 21 (L) 01/30/2021   VD25OH 40 01/24/2020   VD25OH 14 (L) 03/01/2019   Lab Results  Component Value Date   WBC 3.5 (L) 03/06/2021   HGB 12.0 03/06/2021   HCT 37.6 03/06/2021   MCV 86.2 03/06/2021   PLT 233 03/06/2021   Lab Results  Component Value Date   IRON 86 01/24/2020   TIBC CANCELED 01/24/2020   FERRITIN 58 01/24/2020   Attestation Statements:   Reviewed by clinician on day of visit: allergies, medications, problem list, medical history, surgical history, family history, social history, and previous encounter notes.  I, 01/26/2020, CMA, am acting  as transcriptionist for Helane Rima, DO  I have reviewed the above documentation for accuracy and completeness, and I agree with the above. -  Helane Rima, DO, MS, FAAFP, DABOM - Family and Bariatric Medicine.

## 2021-07-25 ENCOUNTER — Other Ambulatory Visit (HOSPITAL_COMMUNITY): Payer: Self-pay

## 2021-07-25 LAB — CBC WITH DIFFERENTIAL/PLATELET
Basophils Absolute: 0 10*3/uL (ref 0.0–0.2)
Basos: 1 %
EOS (ABSOLUTE): 0.1 10*3/uL (ref 0.0–0.4)
Eos: 2 %
Hemoglobin: 12.6 g/dL (ref 11.1–15.9)
Immature Grans (Abs): 0 10*3/uL (ref 0.0–0.1)
Immature Granulocytes: 0 %
Lymphocytes Absolute: 1.7 10*3/uL (ref 0.7–3.1)
Lymphs: 43 %
MCH: 27.9 pg (ref 26.6–33.0)
MCHC: 32.2 g/dL (ref 31.5–35.7)
MCV: 87 fL (ref 79–97)
Monocytes Absolute: 0.3 10*3/uL (ref 0.1–0.9)
Monocytes: 8 %
Neutrophils Absolute: 1.8 10*3/uL (ref 1.4–7.0)
Neutrophils: 46 %
Platelets: 259 10*3/uL (ref 150–450)
RBC: 4.51 x10E6/uL (ref 3.77–5.28)
RDW: 13.8 % (ref 11.7–15.4)
WBC: 4 10*3/uL (ref 3.4–10.8)

## 2021-07-25 LAB — ANEMIA PANEL
Ferritin: 41 ng/mL (ref 15–150)
Folate, Hemolysate: 264 ng/mL
Folate, RBC: 675 ng/mL (ref >498)
Hematocrit: 39.1 % (ref 34.0–46.6)
Iron Saturation: 12 % — ABNORMAL LOW (ref 15–55)
Iron: 42 ug/dL (ref 27–159)
Retic Ct Pct: 0.9 % (ref 0.6–2.6)
Total Iron Binding Capacity: 362 ug/dL (ref 250–450)
UIBC: 320 ug/dL (ref 131–425)
Vitamin B-12: 807 pg/mL (ref 232–1245)

## 2021-07-25 LAB — LIPID PANEL
Chol/HDL Ratio: 3.5 ratio (ref 0.0–4.4)
Cholesterol, Total: 205 mg/dL — ABNORMAL HIGH (ref 100–199)
HDL: 59 mg/dL (ref >39)
LDL Chol Calc (NIH): 131 mg/dL — ABNORMAL HIGH (ref 0–99)
Triglycerides: 84 mg/dL (ref 0–149)
VLDL Cholesterol Cal: 15 mg/dL (ref 5–40)

## 2021-07-25 LAB — COMPREHENSIVE METABOLIC PANEL
ALT: 10 IU/L (ref 0–32)
AST: 12 IU/L (ref 0–40)
Albumin/Globulin Ratio: 1.5 (ref 1.2–2.2)
Albumin: 4.1 g/dL (ref 3.8–4.8)
Alkaline Phosphatase: 72 IU/L (ref 44–121)
BUN/Creatinine Ratio: 19 (ref 9–23)
BUN: 17 mg/dL (ref 6–24)
Bilirubin Total: <0.2 mg/dL (ref 0.0–1.2)
CO2: 25 mmol/L (ref 20–29)
Calcium: 9.1 mg/dL (ref 8.7–10.2)
Chloride: 102 mmol/L (ref 96–106)
Creatinine, Ser: 0.88 mg/dL (ref 0.57–1.00)
Globulin, Total: 2.8 g/dL (ref 1.5–4.5)
Glucose: 102 mg/dL — ABNORMAL HIGH (ref 70–99)
Potassium: 4.6 mmol/L (ref 3.5–5.2)
Sodium: 141 mmol/L (ref 134–144)
Total Protein: 6.9 g/dL (ref 6.0–8.5)
eGFR: 84 mL/min/{1.73_m2} (ref >59)

## 2021-07-25 LAB — HEMOGLOBIN A1C
Est. average glucose Bld gHb Est-mCnc: 123 mg/dL
Hgb A1c MFr Bld: 5.9 % — ABNORMAL HIGH (ref 4.8–5.6)

## 2021-07-25 LAB — VITAMIN D 25 HYDROXY (VIT D DEFICIENCY, FRACTURES): Vit D, 25-Hydroxy: 14.6 ng/mL — ABNORMAL LOW (ref 30.0–100.0)

## 2021-07-25 MED ORDER — SAXENDA 18 MG/3ML ~~LOC~~ SOPN
3.0000 mg | PEN_INJECTOR | SUBCUTANEOUS | 0 refills | Status: DC
  Filled 2021-07-25: qty 15, 30d supply, fill #0
  Filled 2021-07-28: qty 15, 28d supply, fill #0
  Filled 2021-07-31: qty 15, 30d supply, fill #0

## 2021-07-28 ENCOUNTER — Other Ambulatory Visit (HOSPITAL_COMMUNITY): Payer: Self-pay

## 2021-07-29 NOTE — Telephone Encounter (Signed)
Your prior authorization for Abigail Duran has been approved! MORE INFO For eligible patients, copay assistance may be available. To learn more and be redirected to the [place] website, click on the "More Info" button to the right. Please also note that you may need to schedule a follow-up visit with your patient prior to the expiration of this prior authorization, as updated patient weight may be required for reauthorization.  Message from plan: The request has been approved. The authorization is effective for a maximum of 4 fills from 07/26/2021 to 11/22/2021, as long as the member is enrolled in their current health plan. The request was approved as submitted. This request is approved for 76mL per 30 days.Please note: Please note this medication must be filled and/or managed by a [facility] outpatient pharmacy. Please call [phone]. A written notification letter will follow with additional details.

## 2021-07-31 ENCOUNTER — Other Ambulatory Visit (HOSPITAL_COMMUNITY): Payer: Self-pay

## 2021-07-31 MED ORDER — INSULIN PEN NEEDLE 31G X 6 MM MISC
0 refills | Status: DC
  Filled 2021-07-31: qty 100, 90d supply, fill #0

## 2021-08-21 ENCOUNTER — Ambulatory Visit (INDEPENDENT_AMBULATORY_CARE_PROVIDER_SITE_OTHER): Payer: No Typology Code available for payment source | Admitting: Family Medicine

## 2021-08-21 ENCOUNTER — Encounter (INDEPENDENT_AMBULATORY_CARE_PROVIDER_SITE_OTHER): Payer: Self-pay | Admitting: Family Medicine

## 2021-08-21 ENCOUNTER — Other Ambulatory Visit: Payer: Self-pay

## 2021-08-21 VITALS — BP 136/85 | HR 103 | Temp 98.2°F | Ht 63.0 in | Wt 205.0 lb

## 2021-08-21 DIAGNOSIS — Z6836 Body mass index (BMI) 36.0-36.9, adult: Secondary | ICD-10-CM

## 2021-08-21 DIAGNOSIS — E7800 Pure hypercholesterolemia, unspecified: Secondary | ICD-10-CM

## 2021-08-21 DIAGNOSIS — R632 Polyphagia: Secondary | ICD-10-CM

## 2021-08-21 DIAGNOSIS — R7303 Prediabetes: Secondary | ICD-10-CM

## 2021-08-21 DIAGNOSIS — G478 Other sleep disorders: Secondary | ICD-10-CM

## 2021-08-21 DIAGNOSIS — E669 Obesity, unspecified: Secondary | ICD-10-CM

## 2021-08-21 DIAGNOSIS — E66812 Morbid (severe) obesity due to excess calories: Secondary | ICD-10-CM

## 2021-08-21 DIAGNOSIS — Z6838 Body mass index (BMI) 38.0-38.9, adult: Secondary | ICD-10-CM

## 2021-08-21 DIAGNOSIS — E559 Vitamin D deficiency, unspecified: Secondary | ICD-10-CM

## 2021-08-21 DIAGNOSIS — Z9884 Bariatric surgery status: Secondary | ICD-10-CM

## 2021-08-25 NOTE — Progress Notes (Signed)
Chief Complaint:   OBESITY Abigail Duran is here to discuss her progress with her obesity treatment plan along with follow-up of her obesity related diagnoses. See Medical Weight Management Flowsheet for complete bioelectrical impedance results.  Today's visit was #: 4 Starting weight: 217 lbs Starting date: 03/04/2021 Weight change since last visit: 3 lbs Total lbs lost to date: 12 lbs Total weight loss percentage to date: -5.53%  Nutrition Plan: Practicing portion control and making smarter food choices, such as increasing vegetables and decreasing simple carbohydrates for 70-80% of the time. Activity: None. Anti-obesity medications: Saxenda 0.6 mg subcutaneously daily. Reported side effects: None.  Interim History: Abigail Duran says it took 10 days to get the Saxenda.  She is still at the 0.6 mg dose.  She says she will eat after her injection.  She reports needing to take Zofran a few times.  She is having a BM every 3 days.  She needs a refill on her phentermine.  Assessment/Plan:   1. Vitamin D deficiency Not at goal. She is not taking a vitamin D supplement at this time.  Plan: Start vitamin D 50,000 IU once weekly, dispense 12 capsules, no refills.  Lab Results  Component Value Date   VD25OH 14.6 (L) 07/24/2021   VD25OH 21 (L) 01/30/2021   VD25OH 40 01/24/2020   2. Polyphagia Not at goal. Current treatment: phentermine 37.5 mg daily and Saxenda 0.6 mg subcutaneously daily.    Plan: Continue phentermine 37.5 mg daily and Saxenda 0.6 mg subcutaneously dialy.  Will send in refills for both today.  She will continue to focus on protein-rich, low simple carbohydrate foods. We reviewed the importance of hydration, regular exercise for stress reduction, and restorative sleep.  Having again reminded the patient of the "off label" use of Phentermine beyond three consecutive months, and again discussing the risks, benefits, contraindications, and limitations of it's use; given it's role in  the successful treatment of obesity thus far and lack of adverse effect, patient has expressed desire and given informed verbal consent to continue use.   I have consulted the [place] Controlled Substances Registry for this patient, and feel the risk/benefit ratio today is favorable for proceeding with this prescription for a controlled substance. The patient understands monitoring parameters and red flags.   3. Pre-diabetes Stable. Not at goal. Goal is HgbA1c < 5.7.  Medication: None.    Plan: She will continue to focus on protein-rich, low simple carbohydrate foods. We reviewed the importance of hydration, regular exercise for stress reduction, and restorative sleep.   Lab Results  Component Value Date   HGBA1C 5.9 (H) 07/24/2021   4. Pure hypercholesterolemia, mild Course: Mild, Not optimized. Lipid-lowering medications: None.   Plan: Dietary changes: Increase soluble fiber, decrease simple carbohydrates, decrease saturated fat. Exercise changes: Moderate to vigorous-intensity aerobic activity 150 minutes per week or as tolerated. We will continue to monitor along with PCP/specialists as it pertains to her weight loss journey.  Lab Results  Component Value Date   CHOL 205 (H) 07/24/2021   HDL 59 07/24/2021   LDLCALC 131 (H) 07/24/2021   TRIG 84 07/24/2021   CHOLHDL 3.5 07/24/2021   Lab Results  Component Value Date   ALT 10 07/24/2021   AST 12 07/24/2021   ALKPHOS 72 07/24/2021   BILITOT <0.2 07/24/2021   The 10-year ASCVD risk score (Arnett DK, et al., 2019) is: 1%   Values used to calculate the score:     Age: 46 years  Sex: Female     Is Non-Hispanic African American: Yes     Diabetic: No     Tobacco smoker: No     Systolic Blood Pressure: 136 mmHg     Is BP treated: No     HDL Cholesterol: 59 mg/dL     Total Cholesterol: 205 mg/dL  5. Poor sleep pattern Will continue to monitor as it pertains to her weight loss journey.  6. History of bariatric surgery Abigail Duran is  at risk for malnutrition due to her previous bariatric surgery.   Counseling You may need to eat 3 meals and 2 snacks, or 5 small meals each day in order to reach your protein and calorie goals.  Allow at least 15 minutes for each meal so that you can eat mindfully. Listen to your body so that you do not overeat. For most people, your sleeve or pouch will comfortably hold 4-6 ounces. Eat foods from all food groups. This includes fruits and vegetables, grains, dairy, and meat and other proteins. Include a protein-rich food at every meal and snack, and eat the protein food first.  You should be taking a Bariatric Multivitamin as well as calcium.   7. Obesity, current BMI 36.4  Course: Abigail Duran is currently in the action stage of change. As such, her goal is to continue with weight loss efforts.   Nutrition goals: She has agreed to practicing portion control and making smarter food choices, such as increasing vegetables and decreasing simple carbohydrates.   Exercise goals:  Increase NEAT.  Behavioral modification strategies: increasing lean protein intake, decreasing simple carbohydrates, increasing vegetables, and increasing water intake.  Abigail Duran has agreed to follow-up with our clinic in 4 weeks. She was informed of the importance of frequent follow-up visits to maximize her success with intensive lifestyle modifications for her multiple health conditions.   Objective:   Blood pressure 136/85, pulse (!) 103, temperature 98.2 F (36.8 C), temperature source Oral, height 5\' 3"  (1.6 m), weight 205 lb (93 kg), SpO2 97 %. Body mass index is 36.31 kg/m.  General: Cooperative, alert, well developed, in no acute distress. HEENT: Conjunctivae and lids unremarkable. Cardiovascular: Regular rhythm.  Lungs: Normal work of breathing. Neurologic: No focal deficits.   Lab Results  Component Value Date   CREATININE 0.88 07/24/2021   BUN 17 07/24/2021   NA 141 07/24/2021   K 4.6 07/24/2021   CL  102 07/24/2021   CO2 25 07/24/2021   Lab Results  Component Value Date   ALT 10 07/24/2021   AST 12 07/24/2021   ALKPHOS 72 07/24/2021   BILITOT <0.2 07/24/2021   Lab Results  Component Value Date   HGBA1C 5.9 (H) 07/24/2021   HGBA1C 5.8 (H) 01/30/2021   HGBA1C 5.9 (H) 04/09/2020   HGBA1C 5.5 01/24/2020   HGBA1C 5.7 (H) 03/01/2019   Lab Results  Component Value Date   TSH 1.92 01/30/2021   Lab Results  Component Value Date   CHOL 205 (H) 07/24/2021   HDL 59 07/24/2021   LDLCALC 131 (H) 07/24/2021   TRIG 84 07/24/2021   CHOLHDL 3.5 07/24/2021   Lab Results  Component Value Date   VD25OH 14.6 (L) 07/24/2021   VD25OH 21 (L) 01/30/2021   VD25OH 40 01/24/2020   Lab Results  Component Value Date   WBC 4.0 07/24/2021   HGB 12.6 07/24/2021   HCT 39.1 07/24/2021   MCV 87 07/24/2021   PLT 259 07/24/2021   Lab Results  Component Value Date  IRON 42 07/24/2021   TIBC 362 07/24/2021   FERRITIN 41 07/24/2021   Attestation Statements:   Reviewed by clinician on day of visit: allergies, medications, problem list, medical history, surgical history, family history, social history, and previous encounter notes.  I, Insurance claims handler, CMA, am acting as transcriptionist for Helane Rima, DO  I have reviewed the above documentation for accuracy and completeness, and I agree with the above. -  Helane Rima, DO, MS, FAAFP, DABOM - Family and Bariatric Medicine.

## 2021-08-26 ENCOUNTER — Encounter (INDEPENDENT_AMBULATORY_CARE_PROVIDER_SITE_OTHER): Payer: Self-pay | Admitting: Family Medicine

## 2021-08-26 MED ORDER — SAXENDA 18 MG/3ML ~~LOC~~ SOPN: 3.0000 mg | PEN_INJECTOR | SUBCUTANEOUS | 0 refills | Status: DC

## 2021-08-26 MED ORDER — PHENTERMINE HCL 37.5 MG PO TABS: 37.5000 mg | ORAL_TABLET | Freq: Every day | ORAL | 0 refills | Status: DC

## 2021-08-26 MED ORDER — VITAMIN D (ERGOCALCIFEROL) 1.25 MG (50000 UNIT) PO CAPS
50000.0000 [IU] | ORAL_CAPSULE | ORAL | 0 refills | Status: DC
Start: 2021-08-26 — End: 2021-09-23

## 2021-08-26 NOTE — Telephone Encounter (Signed)
Dr.Wallace °

## 2021-09-23 ENCOUNTER — Encounter (INDEPENDENT_AMBULATORY_CARE_PROVIDER_SITE_OTHER): Payer: Self-pay | Admitting: Family Medicine

## 2021-09-23 ENCOUNTER — Other Ambulatory Visit: Payer: Self-pay

## 2021-09-23 ENCOUNTER — Ambulatory Visit (INDEPENDENT_AMBULATORY_CARE_PROVIDER_SITE_OTHER): Payer: No Typology Code available for payment source | Admitting: Family Medicine

## 2021-09-23 VITALS — BP 128/83 | HR 99 | Temp 97.9°F | Ht 63.0 in | Wt 202.0 lb

## 2021-09-23 DIAGNOSIS — F411 Generalized anxiety disorder: Secondary | ICD-10-CM

## 2021-09-23 DIAGNOSIS — R632 Polyphagia: Secondary | ICD-10-CM

## 2021-09-23 DIAGNOSIS — E559 Vitamin D deficiency, unspecified: Secondary | ICD-10-CM | POA: Diagnosis not present

## 2021-09-23 DIAGNOSIS — E669 Obesity, unspecified: Secondary | ICD-10-CM

## 2021-09-23 DIAGNOSIS — Z6835 Body mass index (BMI) 35.0-35.9, adult: Secondary | ICD-10-CM

## 2021-09-23 MED ORDER — VITAMIN D (ERGOCALCIFEROL) 1.25 MG (50000 UNIT) PO CAPS: 50000.0000 [IU] | ORAL_CAPSULE | ORAL | 0 refills | Status: DC

## 2021-09-23 MED ORDER — SAXENDA 18 MG/3ML ~~LOC~~ SOPN: 3.0000 mg | PEN_INJECTOR | SUBCUTANEOUS | 1 refills | Status: DC

## 2021-09-23 MED ORDER — PHENTERMINE HCL 37.5 MG PO TABS: 37.5000 mg | ORAL_TABLET | Freq: Every day | ORAL | 0 refills | Status: DC

## 2021-09-25 NOTE — Progress Notes (Signed)
Chief Complaint:   OBESITY Abigail Duran is here to discuss her progress with her obesity treatment plan along with follow-up of her obesity related diagnoses.   Today's visit was #: 5 Starting weight: 217 lbs Starting date: 03/04/2021 Today's weight: 202 lbs Today's date: 09/23/2020 Weight change since last visit: -3 lbs Total lbs lost to date: 15 lbs Body mass index is 35.78 kg/m.  Total weight loss percentage to date: -2.3%  Current Meal Plan: practicing portion control and making smarter food choices, such as increasing vegetables and decreasing simple carbohydrates for 60-70% of the time.  Current Exercise Plan: Walking for 30+ minutes 3 times per week. Current Anti-Obesity Medications: Saxenda 1.2 mg subcutaneously daily. Side effects: Nausea.  Interim History: Abigail Duran reports that she has been taking Saxenda at the 1.2 mg dose daily for 2 weeks. She says it is helping but is experiencing nausea as a side effect.  Assessment/Plan:   1. Polyphagia Not at goal. Current treatment: phentermine 37.5 mg daily and Saxenda 1.2 mg subcutaneously daily.     Plan: Continue phentermine 37.5 mg daily and Saxenda 1.2 mg subcutaneously dialy.  Will send in refills for both today.  She will continue to focus on protein-rich, low simple carbohydrate foods. We reviewed the importance of hydration, regular exercise for stress reduction, and restorative sleep.   Having again reminded the patient of the "off label" use of Phentermine beyond three consecutive months, and again discussing the risks, benefits, contraindications, and limitations of it's use; given it's role in the successful treatment of obesity thus far and lack of adverse effect, patient has expressed desire and given informed verbal consent to continue use.    I have consulted the [place] Controlled Substances Registry for this patient, and feel the risk/benefit ratio today is favorable for proceeding with this prescription for a controlled  substance. The patient understands monitoring parameters and red flags.   - Refill Liraglutide -Weight Management (SAXENDA) 18 MG/3ML SOPN; Inject 0.6 mg into the skin daily for a week, then 1.2mg  into the skin daily for a week, then 1.8mg  into the skin daily for a week then 2.4mg  into the skin daily for a week then 3mg  in to the skin daily  Dispense: 15 mL; Refill: 1 - Refill phentermine (ADIPEX-P) 37.5 MG tablet; Take 1 tablet (37.5 mg total) by mouth daily before breakfast.  Dispense: 90 tablet; Refill: 0  2. Vitamin D deficiency Not at goal.   Plan: Continue to take prescription Vitamin D @50 ,000 IU every week as prescribed.  Follow-up for routine testing of Vitamin D, at least 2-3 times per year to avoid over-replacement.  Lab Results  Component Value Date   VD25OH 14.6 (L) 07/24/2021   VD25OH 21 (L) 01/30/2021   VD25OH 40 01/24/2020   - Refill Vitamin D, Ergocalciferol, (DRISDOL) 1.25 MG (50000 UNIT) CAPS capsule; Take 1 capsule (50,000 Units total) by mouth every 7 (seven) days.  Dispense: 12 capsule; Refill: 0  3. GAD (generalized anxiety disorder), with emotional eating Improving, but not optimized. Medication: None.   Plan: Discussed cues and consequences, how thoughts affect eating, model of thoughts, feelings, and behaviors, and strategies for change by focusing on the cue. Discussed cognitive distortions, coping thoughts, and how to change your thoughts.  4. Obesity, current BMI 35.8 Course: Abigail Duran is currently in the action stage of change. As such, her goal is to continue with weight loss efforts.   Nutrition goals: She has agreed to practicing portion control and making  smarter food choices, such as increasing vegetables and decreasing simple carbohydrates.   Exercise goals: As is.  Behavioral modification strategies: increasing lean protein intake, decreasing simple carbohydrates, increasing vegetables, and increasing water intake.  Abigail Duran has agreed to follow-up with  our clinic in 6 weeks. She was informed of the importance of frequent follow-up visits to maximize her success with intensive lifestyle modifications for her multiple health conditions.   Objective:   Blood pressure 128/83, pulse 99, temperature 97.9 F (36.6 C), temperature source Oral, height 5\' 3"  (1.6 m), weight 202 lb (91.6 kg), SpO2 99 %. Body mass index is 35.78 kg/m.  General: Cooperative, alert, well developed, in no acute distress. HEENT: Conjunctivae and lids unremarkable. Cardiovascular: Regular rhythm.  Lungs: Normal work of breathing. Neurologic: No focal deficits.   Lab Results  Component Value Date   CREATININE 0.88 07/24/2021   BUN 17 07/24/2021   NA 141 07/24/2021   K 4.6 07/24/2021   CL 102 07/24/2021   CO2 25 07/24/2021   Lab Results  Component Value Date   ALT 10 07/24/2021   AST 12 07/24/2021   ALKPHOS 72 07/24/2021   BILITOT <0.2 07/24/2021   Lab Results  Component Value Date   HGBA1C 5.9 (H) 07/24/2021   HGBA1C 5.8 (H) 01/30/2021   HGBA1C 5.9 (H) 04/09/2020   HGBA1C 5.5 01/24/2020   HGBA1C 5.7 (H) 03/01/2019   Lab Results  Component Value Date   TSH 1.92 01/30/2021   Lab Results  Component Value Date   CHOL 205 (H) 07/24/2021   HDL 59 07/24/2021   LDLCALC 131 (H) 07/24/2021   TRIG 84 07/24/2021   CHOLHDL 3.5 07/24/2021   Lab Results  Component Value Date   VD25OH 14.6 (L) 07/24/2021   VD25OH 21 (L) 01/30/2021   VD25OH 40 01/24/2020   Lab Results  Component Value Date   WBC 4.0 07/24/2021   HGB 12.6 07/24/2021   HCT 39.1 07/24/2021   MCV 87 07/24/2021   PLT 259 07/24/2021   Lab Results  Component Value Date   IRON 42 07/24/2021   TIBC 362 07/24/2021   FERRITIN 41 07/24/2021   Attestation Statements:   Reviewed by clinician on day of visit: allergies, medications, problem list, medical history, surgical history, family history, social history, and previous encounter notes.  07/26/2021 [place], CMA, am acting as  12-03-1972 for Energy manager, DO.  I have reviewed the above documentation for accuracy and completeness, and I agree with the above. -  W. R. Berkley, DO, MS, FAAFP, DABOM - Family and Bariatric Medicine.

## 2021-12-26 ENCOUNTER — Encounter (HOSPITAL_COMMUNITY): Payer: Self-pay | Admitting: *Deleted

## 2022-01-09 ENCOUNTER — Encounter: Payer: Self-pay | Admitting: Medical

## 2022-01-09 ENCOUNTER — Ambulatory Visit (INDEPENDENT_AMBULATORY_CARE_PROVIDER_SITE_OTHER): Payer: No Typology Code available for payment source | Admitting: Medical

## 2022-01-09 VITALS — BP 130/80 | HR 87 | Ht 64.0 in | Wt 200.8 lb

## 2022-01-09 DIAGNOSIS — Z6834 Body mass index (BMI) 34.0-34.9, adult: Secondary | ICD-10-CM | POA: Diagnosis not present

## 2022-01-09 DIAGNOSIS — Z0000 Encounter for general adult medical examination without abnormal findings: Secondary | ICD-10-CM | POA: Diagnosis not present

## 2022-01-09 DIAGNOSIS — Z1231 Encounter for screening mammogram for malignant neoplasm of breast: Secondary | ICD-10-CM

## 2022-01-09 DIAGNOSIS — Z113 Encounter for screening for infections with a predominantly sexual mode of transmission: Secondary | ICD-10-CM

## 2022-01-09 DIAGNOSIS — B360 Pityriasis versicolor: Secondary | ICD-10-CM

## 2022-01-09 DIAGNOSIS — E559 Vitamin D deficiency, unspecified: Secondary | ICD-10-CM

## 2022-01-09 MED ORDER — TERBINAFINE HCL 1 % EX CREA: 1.0000 | TOPICAL_CREAM | Freq: Two times a day (BID) | CUTANEOUS | 0 refills | Status: DC

## 2022-01-09 NOTE — Progress Notes (Signed)
Subjective:   HPI  Abigail Duran is a 46 y.o. female who presents for Chief Complaint  Patient presents with   Annual Exam    Fasting CPE. Paps normally done here. She is fine getting one today if she's due.    Patient Care Team: Lindalou Soltis, Cleda Mccreedy as PCP - General (Family Medicine) Sees dentist Sees eye doctor Dr. Helane Rima, [facility] weight and wellness   Concerns: Doing well  Being seeing weight management clinic.   On Saxenda 1.2mg  since 06/2021, uses phentermine off and on since 9/22  Vit D was low 06/2021, started on Vit D  Wants STD screen  No prior mammogram  New rash on right arm, typically gets similar rash on upper chest in summer, this just started a week or 2 ago when she was on vacation in [place], [place]  Gynecological history: no pregnancies Monogamous, no concern for STD, may consider pregnancy, no desire for contraception Periods are heavy, regular, no birth control Hx/o PCOS, tried metformin in past but didn't tolerate this   Reviewed their medical, surgical, family, social, medication, and allergy history and updated chart as appropriate.  Past Medical History:  Diagnosis Date   Anemia    Anxiety    CIN I (cervical intraepithelial neoplasia I) 07/30/2010   Headache    History of PCOS 2008   History of varicella    Irregular periods/menstrual cycles 12/14/2006   LGSIL (low grade squamous intraepithelial lesion) on Pap smear 08/13/2009   colpo   Obesity    Pre-diabetes    Vitamin D deficiency    Wears glasses     Family History  Problem Relation Age of Onset   Diabetes Mother    Asthma Mother    Depression Mother    Hypertension Mother    Other Mother        back surgery   COPD Mother    Anxiety disorder Mother    Alcohol abuse Father    Early death Father    Hypertension Father    Cirrhosis Father        died of cirrhosis   Diabetes Sister    Lupus Sister    Sickle cell trait Brother    Hypertension Brother    Cancer  Maternal Aunt        brain tumor   Heart disease Maternal Grandmother    Stroke Neg Hx      Current Outpatient Medications:    Liraglutide -Weight Management (SAXENDA) 18 MG/3ML SOPN, Inject 0.6 mg into the skin daily for a week, then 1.2mg  into the skin daily for a week, then 1.8mg  into the skin daily for a week then 2.4mg  into the skin daily for a week then 3mg  in to the skin daily, Disp: 15 mL, Rfl: 1   phentermine (ADIPEX-P) 37.5 MG tablet, Take 1 tablet (37.5 mg total) by mouth daily before breakfast., Disp: 90 tablet, Rfl: 0   terbinafine (LAMISIL AT) 1 % cream, Apply 1 Application topically 2 (two) times daily., Disp: 30 g, Rfl: 0   Vitamin D, Ergocalciferol, (DRISDOL) 1.25 MG (50000 UNIT) CAPS capsule, Take 1 capsule (50,000 Units total) by mouth every 7 (seven) days., Disp: 12 capsule, Rfl: 0  Allergies  Allergen Reactions   Codeine Nausea And Vomiting   Elemental Sulfur Nausea Only   Metformin And Related     Nausea and GI upset   Topiramate Other (See Comments)    Word-finding difficutly    Review of Systems Constitutional: -  fever, -chills, -sweats, -unexpected weight change, -decreased appetite, -fatigue Allergy: -sneezing, -itching, -congestion Dermatology: -changing moles, --rash, -lumps ENT: -runny nose, -ear pain, -sore throat, -hoarseness, -sinus pain, -teeth pain, - ringing in ears, -hearing loss, -nosebleeds Cardiology: -chest pain, -palpitations, -swelling, -difficulty breathing when lying flat, -waking up short of breath Respiratory: -cough, -shortness of breath, -difficulty breathing with exercise or exertion, -wheezing, -coughing up blood Gastroenterology: -abdominal pain, -nausea, -vomiting, -diarrhea, -constipation, -blood in stool, -changes in bowel movement, -difficulty swallowing or eating Hematology: -bleeding, -bruising  Musculoskeletal: -joint aches, -muscle aches, -joint swelling, -back pain, -neck pain, -cramping, -changes in gait Ophthalmology:  denies vision changes, eye redness, itching, discharge Urology: -burning with urination, -difficulty urinating, -blood in urine, -urinary frequency, -urgency, -incontinence Neurology: -headache, -weakness, -tingling, -numbness, -memory loss, -falls, -dizziness Psychology: -depressed mood, -agitation, -sleep problems Breast/gyn: -breast tendnerss, -discharge, -lumps, -vaginal discharge,- irregular periods, -heavy periods      01/09/2022    9:55 AM 03/20/2021    2:02 PM 03/04/2021    7:35 AM 01/30/2021   10:45 AM 04/09/2020    8:01 AM  Depression screen PHQ 2/9  Decreased Interest 0 0 1 1 1   Down, Depressed, Hopeless 0 0 1 1 2   PHQ - 2 Score 0 0 2 2 3   Altered sleeping  0 2 2 1   Tired, decreased energy  0 2 2 2   Change in appetite  0 2 1 0  Feeling bad or failure about yourself   0 2 2 1   Trouble concentrating  0 1 2 0  Moving slowly or fidgety/restless  0 0 2 0  Suicidal thoughts  0 0 0 0  PHQ-9 Score  0 11 13 7   Difficult doing work/chores  Not difficult at all Somewhat difficult Somewhat difficult Somewhat difficult       Objective:  BP 130/80   Pulse 87   Ht 5\' 4"  (1.626 m)   Wt 200 lb 12.8 oz (91.1 kg)   LMP 12/29/2021   SpO2 100%   BMI 34.47 kg/m   General appearance: alert, no distress, WD/WN, African American female Skin: right proximal forearm anteriorly with 5cm x 3cm hypopigmented rash suggestive of tinea versicolor HEENT: normocephalic, conjunctiva/corneas normal, sclerae anicteric, PERRLA, EOMi, nares patent, no discharge or erythema, pharynx normal Oral cavity: MMM, tongue normal, teeth in good repair Neck: supple, no lymphadenopathy, no thyromegaly, no masses, normal ROM, no bruits Chest: non tender, normal shape and expansion Heart: RRR, normal S1, S2, no murmurs Lungs: CTA bilaterally, no wheezes, rhonchi, or rales Abdomen: +bs, soft, non tender, non distended, no masses, no hepatomegaly, no splenomegaly, no bruits Back: non tender, normal ROM, no  scoliosis Musculoskeletal: upper extremities non tender, no obvious deformity, normal ROM throughout, lower extremities non tender, no obvious deformity, normal ROM throughout Extremities: no edema, no cyanosis, no clubbing Pulses: 2+ symmetric, upper and lower extremities, normal cap refill Neurological: alert, oriented x 3, CN2-12 intact, strength normal upper extremities and lower extremities, sensation normal throughout, DTRs 2+ throughout, no cerebellar signs, gait normal Psychiatric: normal affect, behavior normal, pleasant  Breast: nontender, no masses or lumps, no skin changes, no nipple discharge or inversion, no axillary lymphadenopathy Gyn: Normal external genitalia without lesions, vagina with normal mucosa, cervix without lesions, no cervical motion tenderness, no abnormal vaginal discharge.  Uterus and adnexa not enlarged, nontender, no masses.  Exam chaperoned by nurse. Rectal: anus normal appearing    Assessment and Plan :   Encounter Diagnoses  Name Primary?   Encounter  for health maintenance examination in adult Yes   BMI 34.0-34.9,adult    Vitamin D deficiency    Screen for STD (sexually transmitted disease)    Encounter for screening mammogram for malignant neoplasm of breast    Tinea versicolor      This visit was a preventative care visit, also known as wellness visit or routine physical.   Topics typically include healthy lifestyle, diet, exercise, preventative care, vaccinations, sick and well care, proper use of emergency dept and after hours care, as well as other concerns.     Recommendations: Continue to return yearly for your annual wellness and preventative care visits.  This gives [place] a chance to discuss healthy lifestyle, exercise, vaccinations, review your chart record, and perform screenings where appropriate.  I recommend you see your eye doctor yearly for routine vision care.  I recommend you see your dentist yearly for routine dental care including  hygiene visits twice yearly.   Vaccination recommendations were reviewed Immunization History  Administered Date(s) Administered   Influenza,inj,Quad PF,6+ Mos 03/01/2019, 04/08/2020   Influenza-Unspecified 04/10/2014, 04/03/2015   Moderna Sars-Covid-2 Vaccination 06/27/2019, 01/24/2020   Tdap 07/19/2013     Screening for cancer: Colon cancer screening: Age 49  Breast cancer screening: You should perform a self breast exam monthly.   We reviewed recommendations for regular mammograms and breast cancer screening.  Cervical cancer screening: We reviewed recommendations for pap smear screening.   Skin cancer screening: Check your skin regularly for new changes, growing lesions, or other lesions of concern Come in for evaluation if you have skin lesions of concern.  Lung cancer screening: If you have a greater than 20 pack year history of tobacco use, then you may qualify for lung cancer screening with a chest CT scan.   Please call your insurance company to inquire about coverage for this test.  We currently don't have screenings for other cancers besides breast, cervical, colon, and lung cancers.  If you have a strong family history of cancer or have other cancer screening concerns, please let me know.    Bone health: Get at least 150 minutes of aerobic exercise weekly Get weight bearing exercise at least once weekly Bone density test:  A bone density test is an imaging test that uses a type of X-ray to measure the amount of calcium and other minerals in your bones. The test may be used to diagnose or screen you for a condition that causes weak or thin bones (osteoporosis), predict your risk for a broken bone (fracture), or determine how well your osteoporosis treatment is working. The bone density test is recommended for females 65 and older, or females or males <65 if certain risk factors such as thyroid disease, long term use of steroids such as for asthma or rheumatological  issues, vitamin D deficiency, estrogen deficiency, family history of osteoporosis, self or family history of fragility fracture in first degree relative.    Heart health: Get at least 150 minutes of aerobic exercise weekly Limit alcohol It is important to maintain a healthy blood pressure and healthy cholesterol numbers  Heart disease screening: Screening for heart disease includes screening for blood pressure, fasting lipids, glucose/diabetes screening, BMI height to weight ratio, reviewed of smoking status, physical activity, and diet.    Goals include blood pressure 120/80 or less, maintaining a healthy lipid/cholesterol profile, preventing diabetes or keeping diabetes numbers under good control, not smoking or using tobacco products, exercising most days per week or at least 150 minutes per  week of exercise, and eating healthy variety of fruits and vegetables, healthy oils, and avoiding unhealthy food choices like fried food, fast food, high sugar and high cholesterol foods.    Other tests may possibly include EKG test, CT coronary calcium score, echocardiogram, exercise treadmill stress test.     Medical care options: I recommend you continue to seek care here first for routine care.  We try really hard to have available appointments Monday through Friday daytime hours for sick visits, acute visits, and physicals.  Urgent care should be used for after hours and weekends for significant issues that cannot wait till the next day.  The emergency department should be used for significant potentially life-threatening emergencies.  The emergency department is expensive, can often have long wait times for less significant concerns, so try to utilize primary care, urgent care, or telemedicine when possible to avoid unnecessary trips to the emergency department.  Virtual visits and telemedicine have been introduced since the pandemic started in 2020, and can be convenient ways to receive medical care.   We offer virtual appointments as well to assist you in a variety of options to seek medical care.   Separate significant issues discussed: Vit D deficiency - continue supplement, labs today  Obesity - on medication, continue efforts at regular exercise and healthy eating habits  Tinea - begin topical Lamisil x 2-3 weeks   Keyleigh was seen today for annual exam.  Diagnoses and all orders for this visit:  Encounter for health maintenance examination in adult -     MM DIGITAL SCREENING BILATERAL; Future -     VITAMIN D 25 Hydroxy (Vit-D Deficiency, Fractures) -     CBC -     RPR+HIV+GC+CT Panel -     Hepatitis C antibody -     Hepatitis B surface antigen  BMI 34.0-34.9,adult  Vitamin D deficiency -     VITAMIN D 25 Hydroxy (Vit-D Deficiency, Fractures)  Screen for STD (sexually transmitted disease) -     RPR+HIV+GC+CT Panel -     Hepatitis C antibody -     Hepatitis B surface antigen  Encounter for screening mammogram for malignant neoplasm of breast -     MM DIGITAL SCREENING BILATERAL; Future  Tinea versicolor  Other orders -     terbinafine (LAMISIL AT) 1 % cream; Apply 1 Application topically 2 (two) times daily.   Follow-up pending labs, yearly for physical

## 2022-01-11 ENCOUNTER — Other Ambulatory Visit: Payer: Self-pay | Admitting: Medical

## 2022-01-11 MED ORDER — VITAMIN D 50 MCG (2000 UT) PO CAPS: 1.0000 | ORAL_CAPSULE | Freq: Every day | ORAL | 1 refills | Status: DC

## 2022-01-12 LAB — CBC
Hematocrit: 35.4 % (ref 34.0–46.6)
Hemoglobin: 11.9 g/dL (ref 11.1–15.9)
MCH: 28.5 pg (ref 26.6–33.0)
MCHC: 33.6 g/dL (ref 31.5–35.7)
MCV: 85 fL (ref 79–97)
Platelets: 228 10*3/uL (ref 150–450)
RBC: 4.17 x10E6/uL (ref 3.77–5.28)
RDW: 13.7 % (ref 11.7–15.4)
WBC: 3.7 10*3/uL (ref 3.4–10.8)

## 2022-01-12 LAB — RPR+HIV+GC+CT PANEL
Chlamydia trachomatis, NAA: NEGATIVE
HIV Screen 4th Generation wRfx: NONREACTIVE
Neisseria Gonorrhoeae by PCR: NEGATIVE
RPR Ser Ql: NONREACTIVE

## 2022-01-12 LAB — HEPATITIS B SURFACE ANTIGEN: Hepatitis B Surface Ag: NEGATIVE

## 2022-01-12 LAB — HEPATITIS C ANTIBODY: Hep C Virus Ab: NONREACTIVE

## 2022-01-12 LAB — VITAMIN D 25 HYDROXY (VIT D DEFICIENCY, FRACTURES): Vit D, 25-Hydroxy: 20.6 ng/mL — ABNORMAL LOW (ref 30.0–100.0)

## 2022-01-19 ENCOUNTER — Other Ambulatory Visit: Payer: Self-pay | Admitting: Medical

## 2022-01-19 MED ORDER — CLOTRIMAZOLE-BETAMETHASONE 1-0.05 % EX CREA: TOPICAL_CREAM | CUTANEOUS | 1 refills | Status: DC

## 2022-01-21 ENCOUNTER — Ambulatory Visit
Admission: RE | Admit: 2022-01-21 | Discharge: 2022-01-21 | Disposition: A | Discharge status: Home / Self Care | Payer: No Typology Code available for payment source | Source: Ambulatory Visit | Attending: Medical | Admitting: Medical

## 2022-01-21 DIAGNOSIS — Z0000 Encounter for general adult medical examination without abnormal findings: Secondary | ICD-10-CM

## 2022-01-21 DIAGNOSIS — Z1231 Encounter for screening mammogram for malignant neoplasm of breast: Secondary | ICD-10-CM

## 2022-02-04 ENCOUNTER — Encounter (INDEPENDENT_AMBULATORY_CARE_PROVIDER_SITE_OTHER): Payer: Self-pay

## 2022-03-04 ENCOUNTER — Encounter: Payer: Self-pay | Admitting: Internal Medicine

## 2022-04-29 ENCOUNTER — Other Ambulatory Visit (HOSPITAL_COMMUNITY): Payer: Self-pay

## 2022-04-29 MED ORDER — SAXENDA 18 MG/3ML ~~LOC~~ SOPN
1.2000 mg | PEN_INJECTOR | Freq: Every day | SUBCUTANEOUS | 1 refills | Status: DC
  Filled 2022-04-29: qty 6, 30d supply, fill #0

## 2022-05-04 ENCOUNTER — Other Ambulatory Visit (HOSPITAL_COMMUNITY): Payer: Self-pay

## 2022-05-07 ENCOUNTER — Other Ambulatory Visit (HOSPITAL_COMMUNITY): Payer: Self-pay

## 2022-08-20 DIAGNOSIS — F411 Generalized anxiety disorder: Secondary | ICD-10-CM | POA: Diagnosis not present

## 2022-08-20 DIAGNOSIS — E559 Vitamin D deficiency, unspecified: Secondary | ICD-10-CM | POA: Diagnosis not present

## 2022-08-20 DIAGNOSIS — Z9884 Bariatric surgery status: Secondary | ICD-10-CM | POA: Diagnosis not present

## 2022-08-20 DIAGNOSIS — R7303 Prediabetes: Secondary | ICD-10-CM | POA: Diagnosis not present

## 2022-08-20 DIAGNOSIS — R635 Abnormal weight gain: Secondary | ICD-10-CM | POA: Diagnosis not present

## 2022-08-20 DIAGNOSIS — Z6836 Body mass index (BMI) 36.0-36.9, adult: Secondary | ICD-10-CM | POA: Diagnosis not present

## 2022-08-20 DIAGNOSIS — R632 Polyphagia: Secondary | ICD-10-CM | POA: Diagnosis not present

## 2022-08-21 ENCOUNTER — Other Ambulatory Visit (HOSPITAL_COMMUNITY): Payer: Self-pay

## 2022-08-21 MED ORDER — ZEPBOUND 2.5 MG/0.5ML ~~LOC~~ SOAJ
2.5000 mg | SUBCUTANEOUS | 1 refills | Status: DC
  Filled 2022-08-21: qty 2, 28d supply, fill #0

## 2022-08-21 MED ORDER — ERGOCALCIFEROL 1.25 MG (50000 UT) PO CAPS
50000.0000 [IU] | ORAL_CAPSULE | ORAL | 1 refills | Status: DC
  Filled 2022-08-21: qty 4, 28d supply, fill #0
  Filled 2022-10-22: qty 4, 28d supply, fill #1

## 2022-08-21 MED ORDER — PHENTERMINE HCL 37.5 MG PO TABS
37.5000 mg | ORAL_TABLET | Freq: Every day | ORAL | 0 refills | Status: DC
  Filled 2022-08-21: qty 30, 30d supply, fill #0

## 2022-08-24 ENCOUNTER — Other Ambulatory Visit (HOSPITAL_COMMUNITY): Payer: Self-pay

## 2022-08-31 ENCOUNTER — Other Ambulatory Visit (HOSPITAL_COMMUNITY): Payer: Self-pay

## 2022-09-03 ENCOUNTER — Other Ambulatory Visit (HOSPITAL_COMMUNITY): Payer: Self-pay

## 2022-10-16 ENCOUNTER — Other Ambulatory Visit (HOSPITAL_COMMUNITY): Payer: Self-pay

## 2022-10-19 ENCOUNTER — Other Ambulatory Visit (HOSPITAL_COMMUNITY): Payer: Self-pay

## 2022-10-19 MED ORDER — PHENTERMINE HCL 37.5 MG PO TABS
37.5000 mg | ORAL_TABLET | Freq: Every day | ORAL | 0 refills | Status: DC
  Filled 2022-10-19: qty 30, 30d supply, fill #0

## 2022-10-22 ENCOUNTER — Other Ambulatory Visit: Payer: Self-pay

## 2022-12-07 ENCOUNTER — Other Ambulatory Visit (HOSPITAL_COMMUNITY): Payer: Self-pay

## 2022-12-17 ENCOUNTER — Other Ambulatory Visit (HOSPITAL_COMMUNITY): Payer: Self-pay

## 2022-12-21 ENCOUNTER — Other Ambulatory Visit (HOSPITAL_COMMUNITY): Payer: Self-pay

## 2022-12-25 ENCOUNTER — Other Ambulatory Visit (HOSPITAL_COMMUNITY): Payer: Self-pay

## 2023-01-04 ENCOUNTER — Other Ambulatory Visit (HOSPITAL_COMMUNITY): Payer: Self-pay

## 2023-01-11 ENCOUNTER — Ambulatory Visit: Payer: 59 | Admitting: Medical

## 2023-01-11 ENCOUNTER — Encounter: Payer: Self-pay | Admitting: Medical

## 2023-01-11 VITALS — BP 120/82 | HR 93 | Ht 63.25 in | Wt 194.8 lb

## 2023-01-11 DIAGNOSIS — Z9884 Bariatric surgery status: Secondary | ICD-10-CM | POA: Diagnosis not present

## 2023-01-11 DIAGNOSIS — E559 Vitamin D deficiency, unspecified: Secondary | ICD-10-CM | POA: Diagnosis not present

## 2023-01-11 DIAGNOSIS — Z6834 Body mass index (BMI) 34.0-34.9, adult: Secondary | ICD-10-CM

## 2023-01-11 DIAGNOSIS — Z13220 Encounter for screening for lipoid disorders: Secondary | ICD-10-CM

## 2023-01-11 DIAGNOSIS — R632 Polyphagia: Secondary | ICD-10-CM

## 2023-01-11 DIAGNOSIS — Z113 Encounter for screening for infections with a predominantly sexual mode of transmission: Secondary | ICD-10-CM

## 2023-01-11 DIAGNOSIS — E282 Polycystic ovarian syndrome: Secondary | ICD-10-CM | POA: Diagnosis not present

## 2023-01-11 DIAGNOSIS — Z0000 Encounter for general adult medical examination without abnormal findings: Secondary | ICD-10-CM | POA: Diagnosis not present

## 2023-01-11 DIAGNOSIS — R7303 Prediabetes: Secondary | ICD-10-CM | POA: Diagnosis not present

## 2023-01-11 DIAGNOSIS — Z1231 Encounter for screening mammogram for malignant neoplasm of breast: Secondary | ICD-10-CM | POA: Diagnosis not present

## 2023-01-11 LAB — LIPID PANEL

## 2023-01-11 LAB — COMPREHENSIVE METABOLIC PANEL
AST: 18 IU/L (ref 0–40)
Albumin: 4.1 g/dL (ref 3.9–4.9)
BUN/Creatinine Ratio: 16 (ref 9–23)
CO2: 24 mmol/L (ref 20–29)
Calcium: 9.5 mg/dL (ref 8.7–10.2)
Creatinine, Ser: 0.96 mg/dL (ref 0.57–1.00)

## 2023-01-11 LAB — CBC
MCH: 26.5 pg — ABNORMAL LOW (ref 26.6–33.0)
MCHC: 31.1 g/dL — ABNORMAL LOW (ref 31.5–35.7)
MCV: 85 fL (ref 79–97)

## 2023-01-11 LAB — VITAMIN D 25 HYDROXY (VIT D DEFICIENCY, FRACTURES)

## 2023-01-11 LAB — POCT URINALYSIS DIP (PROADVANTAGE DEVICE)
Bilirubin, UA: NEGATIVE
Glucose, UA: NEGATIVE mg/dL
Leukocytes, UA: NEGATIVE
Nitrite, UA: NEGATIVE
Specific Gravity, Urine: 1.01
Urobilinogen, Ur: NEGATIVE
pH, UA: 7.5 (ref 5.0–8.0)

## 2023-01-11 LAB — RPR+HIV+GC+CT PANEL

## 2023-01-11 MED ORDER — QSYMIA 7.5-46 MG PO CP24: 1.0000 | ORAL_CAPSULE | ORAL | 1 refills | Status: DC

## 2023-01-11 MED ORDER — CLOTRIMAZOLE-BETAMETHASONE 1-0.05 % EX CREA: TOPICAL_CREAM | CUTANEOUS | 1 refills | Status: DC

## 2023-01-11 NOTE — Progress Notes (Signed)
Subjective: Chief Complaint  Patient presents with   Annual Exam    Fasting cpe, no concerns, declines colonoscopy   Medical Team: Dr. Helane Rima, weight management clinic Dentist, Dr. Darrol Jump Dr. Lorin Picket, eye doctor Kadeisha Betsch, Kermit Balo, PA-C here for primary care   Concerns: Has rash periodically on left forearm, dots of whitish coloration, got thi last year after coming back from beach.  Lotrisone helps and wants to try this again  Has been seeing weight management, but too expensive to go there monthly.  Has been using phentermine daily. Is exercising, trying to maintain healthy diet.   Reviewed their medical, surgical, family, social, medication, and allergy history and updated chart as appropriate.  Allergies  Allergen Reactions   Codeine Nausea And Vomiting   Elemental Sulfur Nausea Only   Metformin And Related     Nausea and GI upset    Past Medical History:  Diagnosis Date   Anemia    Anxiety    CIN I (cervical intraepithelial neoplasia I) 07/30/2010   Headache    History of PCOS 2008   History of varicella    Irregular periods/menstrual cycles 12/14/2006   LGSIL (low grade squamous intraepithelial lesion) on Pap smear 08/13/2009   colpo   Obesity    Pre-diabetes    Vitamin D deficiency    Wears glasses     No current outpatient medications on file prior to visit.   No current facility-administered medications on file prior to visit.      Current Outpatient Medications:    clotrimazole-betamethasone (LOTRISONE) cream, Apply to affected area 2 times daily, Disp: 15 g, Rfl: 1   Phentermine-Topiramate (QSYMIA) 7.5-46 MG CP24, Take 1 capsule by mouth every morning., Disp: 30 capsule, Rfl: 1  Family History  Problem Relation Age of Onset   Diabetes Mother    Asthma Mother    Depression Mother    Hypertension Mother    Other Mother        back surgery   COPD Mother    Anxiety disorder Mother    Alcohol abuse Father    Early death Father     Hypertension Father    Cirrhosis Father        died of cirrhosis   Diabetes Sister    Lupus Sister    Sickle cell trait Brother    Hypertension Brother    Cancer Maternal Aunt        brain tumor   Heart disease Maternal Grandmother    Stroke Neg Hx     Past Surgical History:  Procedure Laterality Date   COLPOSCOPY  2010   LAPAROSCOPIC GASTRIC SLEEVE RESECTION N/A 05/31/2017   Procedure: LAPAROSCOPIC GASTRIC SLEEVE RESECTION, UPPER ENDOSCOPY;  Surgeon: Kinsinger, De Blanch, MD;  Location: [facility];  Service: General;  Laterality: N/A;    Review of Systems  Constitutional:  Negative for chills, fever, malaise/fatigue and weight loss.  HENT:  Negative for congestion, ear pain, hearing loss, sore throat and tinnitus.   Eyes:  Negative for blurred vision, pain and redness.  Respiratory:  Negative for cough, hemoptysis and shortness of breath.   Cardiovascular:  Negative for chest pain, palpitations, orthopnea, claudication and leg swelling.  Gastrointestinal:  Negative for abdominal pain, blood in stool, constipation, diarrhea, nausea and vomiting.  Genitourinary:  Negative for dysuria, flank pain, frequency, hematuria and urgency.  Musculoskeletal:  Negative for falls, joint pain and myalgias.  Skin:  Positive for rash. Negative for itching.  Neurological:  Negative for dizziness,  tingling, speech change, weakness and headaches.  Endo/Heme/Allergies:  Negative for polydipsia. Does not bruise/bleed easily.  Psychiatric/Behavioral:  Negative for depression and memory loss. The patient is not nervous/anxious and does not have insomnia.         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 HEENT: normocephalic, conjunctiva/corneas normal, sclerae anicteric, PERRLA, EOMi, nares patent, no discharge or erythema, pharynx normal Oral cavity: MMM, tongue normal, teeth normal Neck: supple, no lymphadenopathy, no thyromegaly, no masses, normal ROM, no bruits Chest: non tender, normal shape and expansion Heart: RRR, normal S1, S2, no murmurs Lungs: CTA bilaterally, no wheezes, rhonchi, or rales Abdomen: +bs, soft, non tender, non distended, no masses, no hepatomegaly, no splenomegaly, no bruits Back: non tender, normal ROM, no scoliosis Musculoskeletal: upper extremities non tender, no obvious deformity, normal ROM throughout, lower extremities non tender, no obvious deformity, normal ROM throughout Extremities: no edema, no cyanosis, no clubbing Pulses: 2+ symmetric, upper and lower extremities, normal cap refill Neurological: alert, oriented x 3, CN2-12 intact, strength normal upper extremities and lower extremities, sensation normal throughout, DTRs 2+ throughout, no cerebellar signs, gait normal Psychiatric: normal affect, behavior normal, pleasant  Breast: nontender, no masses or lumps, no skin changes, no nipple discharge or inversion, no axillary lymphadenopathy Gyn/rectal - deferred     Assessment and Plan :   Encounter Diagnoses  Name Primary?   Encounter for health maintenance examination in adult Yes   Vitamin D deficiency    Pre-diabetes    PCOS (polycystic ovarian syndrome)    History of bariatric surgery    Polyphagia    Screen for STD (sexually transmitted disease)    Screening for lipid disorders    Encounter for screening mammogram for malignant neoplasm of breast    BMI 34.0-34.9,adult      This visit was a preventative care  visit, also known as wellness visit or routine physical.   Topics typically include healthy lifestyle, diet, exercise, preventative care, vaccinations, sick and well care, proper use of emergency dept and after hours care, as well as other concerns.    Separate significant issues discussed: Weight management Discussed medicaiton options, follow up, exercise, diet.   Advised to limit plain phentermine due to risks.  Consider modification of current therapy Work on getting weight bearing and aerobic exercise Make sure your walking and aerobic exercise is moderate intensity, 45-60 minutes at least 4 days per week Eat a good variety of fruits, vegetables and whole grains Begin trial of Qsymia which contains phentermine.   If this is too expensive or not covered by insurance, let me know and we can go back to Phentermine but at reduced dose for safety Consider using My Fitness pal app, limiting calories to 1500 per day, and set regular goals that are within reach  Rash Begin short term Lotrisone for 1.5 week or less Use daily moisturizing lotion Consider  using Delsym shampoo on arm twice weekly, leave on 10 minutes then rinse off   General Recommendations: Continue to return yearly for your annual wellness and preventative care visits.  This gives [place] a chance to discuss healthy lifestyle, exercise, vaccinations, review your chart record, and perform screenings where appropriate.  I recommend you see your eye doctor yearly for routine vision care.  I recommend you see your dentist yearly for routine dental care including hygiene visits twice yearly.   Vaccination recommendations were reviewed Immunization History  Administered Date(s) Administered   Influenza,inj,Quad PF,6+ Mos 03/01/2019, 04/08/2020   Influenza-Unspecified 04/10/2014, 04/03/2015   Moderna Sars-Covid-2 Vaccination 06/27/2019, 01/24/2020   Tdap 07/19/2013    Vaccine recommendations: Yearly flu shot   Screening for  cancer: Colon cancer screening: Discussed Cologuard vs colonoscopy.  She will consider but declines today   Breast cancer screening: You should perform a self breast exam monthly.   We reviewed recommendations for regular mammograms and breast cancer screening. Last mammogram: 12/2021.  Cervical cancer screening: We reviewed recommendations for pap smear screening. Last pap 2022 reviewed   Skin cancer screening: Check your skin regularly for new changes, growing lesions, or other lesions of concern Come in for evaluation if you have skin lesions of concern.  Lung cancer screening: If you have a greater than 20 pack year history of tobacco use, then you may qualify for lung cancer screening with a chest CT scan.   Please call your insurance company to inquire about coverage for this test.  Pancreatic cancer: no current screening test is available routinely recommended.  (Risk factors: Smoking, overweight or obese, diabetes, chronic pancreatitis, work Nurse, mental health, Solicitor, 28 year old or greater, female greater than female, African-American, family history of pancreatic cancer, hereditary breast, ovarian, melanoma, Lynch, Peutz-jeghers).  We currently don't have screenings for other cancers besides breast, cervical, colon, and lung cancers.  If you have a strong family history of cancer or have other cancer screening concerns, please let me know.    Bone health: Get at least 150 minutes of aerobic exercise weekly Get weight bearing exercise at least once weekly Bone density test:  A bone density test is an imaging test that uses a type of X-ray to measure the amount of calcium and other minerals in your bones. The test may be used to diagnose or screen you for a condition that causes weak or thin bones (osteoporosis), predict your risk for a broken bone (fracture), or determine how well your osteoporosis treatment is working. The bone density test is recommended for females  65 and older, or females or males <65 if certain risk factors such as thyroid disease, long term use of steroids such as for asthma or rheumatological issues, vitamin D deficiency, estrogen deficiency, family history of osteoporosis, self or family history of fragility fracture in first degree relative.    Heart health: Get at least 150 minutes of aerobic exercise weekly Limit alcohol It is important to maintain a healthy blood pressure and healthy cholesterol numbers  Heart disease screening: Screening for heart disease includes screening for blood pressure, fasting lipids, glucose/diabetes screening, BMI height to weight ratio, reviewed of smoking status, physical activity, and diet.    Goals include blood pressure 120/80 or less, maintaining a healthy lipid/cholesterol profile, preventing diabetes or keeping diabetes numbers under good control, not smoking or using tobacco products, exercising most days per week or at least 150 minutes per week of exercise, and eating healthy variety of fruits and vegetables, healthy oils, and  avoiding unhealthy food choices like fried food, fast food, high sugar and high cholesterol foods.    Other tests may possibly include EKG test, CT coronary calcium score, echocardiogram, exercise treadmill stress test.   Reviewed EKG on file from 2022   Vascular disease screening: For high risk individuals including smokers, diabetes, patients with known heart disease or high blood pressure, kidney disease, and others, screening for vascular disease or atherosclerosis of the arteries is available.  Examples may include carotid ultrasound, abdominal aortic ultrasound, ABI blood flow screening in the legs, thoracic aorta screening.   Medical care options: I recommend you continue to seek care here first for routine care.  We try really hard to have available appointments Monday through Friday daytime hours for sick visits, acute visits, and physicals.  Urgent care  should be used for after hours and weekends for significant issues that cannot wait till the next day.  The emergency department should be used for significant potentially life-threatening emergencies.  The emergency department is expensive, can often have long wait times for less significant concerns, so try to utilize primary care, urgent care, or telemedicine when possible to avoid unnecessary trips to the emergency department.  Virtual visits and telemedicine have been introduced since the pandemic started in 2020, and can be convenient ways to receive medical care.  We offer virtual appointments as well to assist you in a variety of options to seek medical care.   Legal Take the time to do a Last Will and Testament, advanced directives including Healthcare Power of Attorney and Living Will documents.  Do not leave your family with burdens that can be handled ahead of time.   Advanced Directives: I recommend you consider completing a Health Care Power of Attorney and Living Will.   These documents respect your wishes and help alleviate burdens on your loved ones if you were to become terminally ill or be in a position to need those documents enforced.    You can complete Advanced Directives yourself, have them notarized, then have copies made for our office, for you and for anybody you feel should have them in safe keeping.  Or, you can have an attorney prepare these documents.   If you haven't updated your Last Will and Testament in a while, it may be worthwhile having an attorney prepare these documents together and save on some costs.       Spiritual and Emotional Health Keeping a healthy spiritual life can help you better manage your physical health. Your spiritual life can help you to cope with any issues that may arise with your physical health.  Balance can keep [place] healthy and help [place] to recover.  If you are struggling with your spiritual health there are questions that you may want to  ask yourself:  What makes me feel most complete? When do I feel most connected to the rest of the world? Where do I find the most inner strength? What am I doing when I feel whole?  Helpful tips: Being in nature. Some people feel very connected and at peace when they are walking outdoors or are outside. Helping others. Some feel the largest sense of wellbeing when they are of service to others. Being of service can take on many forms. It can be doing volunteer work, being kind to strangers, or offering a hand to a friend in need. Gratitude. Some people find they feel the most connected when they remain grateful. They may make lists of all the  things they are grateful for or say a thank you out loud for all they have.    Emotional Health Are you in tune with your emotional health?  Check out this link: http://www.marquez-love.com/   Financial Health Make sure you use a budget for your personal finances Make sure you are insured against risks (health insurance, life insurance, auto insurance, etc) Save more, spend less Set financial goals If you need help in this area, good resources include counseling through [employer] or other community resources, have a meeting with a Social research officer, government, and a good resource is Salley Slaughter podcast    Sueanne was seen today for annual exam.  Diagnoses and all orders for this visit:  Encounter for health maintenance examination in adult -     Comprehensive metabolic panel -     CBC -     Hemoglobin A1c -     VITAMIN D 25 Hydroxy (Vit-D Deficiency, Fractures) -     Lipid panel -     POCT Urinalysis DIP (Proadvantage Device) -     Insulin, random -     RPR+HIV+GC+CT Panel -     Hepatitis B surface antigen -     Hepatitis C antibody -     MM 3D SCREENING MAMMOGRAM BILATERAL BREAST  Vitamin D deficiency -     VITAMIN D 25 Hydroxy (Vit-D Deficiency, Fractures)  Pre-diabetes -     Hemoglobin A1c -     Insulin,  random  PCOS (polycystic ovarian syndrome) -     Insulin, random  History of bariatric surgery  Polyphagia  Screen for STD (sexually transmitted disease) -     RPR+HIV+GC+CT Panel -     Hepatitis B surface antigen -     Hepatitis C antibody  Screening for lipid disorders -     Lipid panel  Encounter for screening mammogram for malignant neoplasm of breast -     MM 3D SCREENING MAMMOGRAM BILATERAL BREAST  BMI 34.0-34.9,adult  Other orders -     Discontinue: Phentermine-Topiramate (QSYMIA) 7.5-46 MG CP24; Take 1 capsule by mouth every morning. -     clotrimazole-betamethasone (LOTRISONE) cream; Apply to affected area 2 times daily -     Phentermine-Topiramate (QSYMIA) 7.5-46 MG CP24; Take 1 capsule by mouth every morning.     Follow-up pending labs, yearly for physical

## 2023-01-11 NOTE — Patient Instructions (Addendum)
This visit was a preventative care visit, also known as wellness visit or routine physical.   Topics typically include healthy lifestyle, diet, exercise, preventative care, vaccinations, sick and well care, proper use of emergency dept and after hours care, as well as other concerns.    Separate significant issues discussed: Weight management Work on getting weight bearing and aerobic exercise Make sure your walking and aerobic exercise is moderate intensity, 45-60 minutes at least 4 days per week Eat a good variety of fruits, vegetables and whole grains Begin trial of Qsymia which contains phentermine.   If this is too expensive or not covered by insurance, let me know and we can go back to Phentermine but at reduced dose for safety Consider using My Fitness pal app, limiting calories to 1500 per day, and set regular goals that are within reach  Rash Begin short term Lotrisone for 1.5 week or less Use daily moisturizing lotion Consider using Delsym shampoo on arm twice weekly, leave on 10 minutes then rinse off  General Recommendations: Continue to return yearly for your annual wellness and preventative care visits.  This gives [place] a chance to discuss healthy lifestyle, exercise, vaccinations, review your chart record, and perform screenings where appropriate.  I recommend you see your eye doctor yearly for routine vision care.  I recommend you see your dentist yearly for routine dental care including hygiene visits twice yearly.   Vaccination recommendations were reviewed Immunization History  Administered Date(s) Administered   Influenza,inj,Quad PF,6+ Mos 03/01/2019, 04/08/2020   Influenza-Unspecified 04/10/2014, 04/03/2015   Moderna Sars-Covid-2 Vaccination 06/27/2019, 01/24/2020   Tdap 07/19/2013    Vaccine recommendations: Yearly flu shot   Screening for cancer: Colon cancer screening: Discussed cologuard vs colonscopy.  She will consider but declines today   Breast  cancer screening: You should perform a self breast exam monthly.   We reviewed recommendations for regular mammograms and breast cancer screening. Last mammogram: 12/2021.  Cervical cancer screening: We reviewed recommendations for pap smear screening. Last pap 2022 reviewed   Skin cancer screening: Check your skin regularly for new changes, growing lesions, or other lesions of concern Come in for evaluation if you have skin lesions of concern.  Lung cancer screening: If you have a greater than 20 pack year history of tobacco use, then you may qualify for lung cancer screening with a chest CT scan.   Please call your insurance company to inquire about coverage for this test.  Pancreatic cancer: no current screening test is available routinely recommended.  (Risk factors: Smoking, overweight or obese, diabetes, chronic pancreatitis, work Nurse, mental health, Solicitor, 68 year old or greater, female greater than female, African-American, family history of pancreatic cancer, hereditary breast, ovarian, melanoma, Lynch, Peutz-jeghers).  We currently don't have screenings for other cancers besides breast, cervical, colon, and lung cancers.  If you have a strong family history of cancer or have other cancer screening concerns, please let me know.    Bone health: Get at least 150 minutes of aerobic exercise weekly Get weight bearing exercise at least once weekly Bone density test:  A bone density test is an imaging test that uses a type of X-ray to measure the amount of calcium and other minerals in your bones. The test may be used to diagnose or screen you for a condition that causes weak or thin bones (osteoporosis), predict your risk for a broken bone (fracture), or determine how well your osteoporosis treatment is working. The bone density test is recommended for females 65 and older,  or females or males <65 if certain risk factors such as thyroid disease, long term use of steroids such as  for asthma or rheumatological issues, vitamin D deficiency, estrogen deficiency, family history of osteoporosis, self or family history of fragility fracture in first degree relative.    Heart health: Get at least 150 minutes of aerobic exercise weekly Limit alcohol It is important to maintain a healthy blood pressure and healthy cholesterol numbers  Heart disease screening: Screening for heart disease includes screening for blood pressure, fasting lipids, glucose/diabetes screening, BMI height to weight ratio, reviewed of smoking status, physical activity, and diet.    Goals include blood pressure 120/80 or less, maintaining a healthy lipid/cholesterol profile, preventing diabetes or keeping diabetes numbers under good control, not smoking or using tobacco products, exercising most days per week or at least 150 minutes per week of exercise, and eating healthy variety of fruits and vegetables, healthy oils, and avoiding unhealthy food choices like fried food, fast food, high sugar and high cholesterol foods.    Other tests may possibly include EKG test, CT coronary calcium score, echocardiogram, exercise treadmill stress test.   Reviewed EKG on file from 2022   Vascular disease screening: For high risk individuals including smokers, diabetes, patients with known heart disease or high blood pressure, kidney disease, and others, screening for vascular disease or atherosclerosis of the arteries is available.  Examples may include carotid ultrasound, abdominal aortic ultrasound, ABI blood flow screening in the legs, thoracic aorta screening.   Medical care options: I recommend you continue to seek care here first for routine care.  We try really hard to have available appointments Monday through Friday daytime hours for sick visits, acute visits, and physicals.  Urgent care should be used for after hours and weekends for significant issues that cannot wait till the next day.  The emergency  department should be used for significant potentially life-threatening emergencies.  The emergency department is expensive, can often have long wait times for less significant concerns, so try to utilize primary care, urgent care, or telemedicine when possible to avoid unnecessary trips to the emergency department.  Virtual visits and telemedicine have been introduced since the pandemic started in 2020, and can be convenient ways to receive medical care.  We offer virtual appointments as well to assist you in a variety of options to seek medical care.   Legal Take the time to do a Last Will and Testament, advanced directives including Healthcare Power of Attorney and Living Will documents.  Do not leave your family with burdens that can be handled ahead of time.   Advanced Directives: I recommend you consider completing a Health Care Power of Attorney and Living Will.   These documents respect your wishes and help alleviate burdens on your loved ones if you were to become terminally ill or be in a position to need those documents enforced.    You can complete Advanced Directives yourself, have them notarized, then have copies made for our office, for you and for anybody you feel should have them in safe keeping.  Or, you can have an attorney prepare these documents.   If you haven't updated your Last Will and Testament in a while, it may be worthwhile having an attorney prepare these documents together and save on some costs.       Spiritual and Emotional Health Keeping a healthy spiritual life can help you better manage your physical health. Your spiritual life can help you to cope with  any issues that may arise with your physical health.  Balance can keep [place] healthy and help [place] to recover.  If you are struggling with your spiritual health there are questions that you may want to ask yourself:  What makes me feel most complete? When do I feel most connected to the rest of the world? Where do  I find the most inner strength? What am I doing when I feel whole?  Helpful tips: Being in nature. Some people feel very connected and at peace when they are walking outdoors or are outside. Helping others. Some feel the largest sense of wellbeing when they are of service to others. Being of service can take on many forms. It can be doing volunteer work, being kind to strangers, or offering a hand to a friend in need. Gratitude. Some people find they feel the most connected when they remain grateful. They may make lists of all the things they are grateful for or say a thank you out loud for all they have.    Emotional Health Are you in tune with your emotional health?  Check out this link: http://www.marquez-love.com/   Financial Health Make sure you use a budget for your personal finances Make sure you are insured against risks (health insurance, life insurance, auto insurance, etc) Save more, spend less Set financial goals If you need help in this area, good resources include counseling through [employer] or other community resources, have a meeting with a Social research officer, government, and a good resource is [employer]

## 2023-01-12 ENCOUNTER — Encounter (HOSPITAL_COMMUNITY): Payer: Self-pay | Admitting: *Deleted

## 2023-01-12 ENCOUNTER — Other Ambulatory Visit: Payer: Self-pay | Admitting: Medical

## 2023-01-12 LAB — INSULIN, RANDOM: INSULIN: 13.2 u[IU]/mL (ref 2.6–24.9)

## 2023-01-12 MED ORDER — VITAMIN D (ERGOCALCIFEROL) 1.25 MG (50000 UNIT) PO CAPS: 50000.0000 [IU] | ORAL_CAPSULE | ORAL | 3 refills | Status: DC

## 2023-01-12 NOTE — Progress Notes (Signed)
 Results sent through MyChart

## 2023-01-13 LAB — LIPID PANEL
Chol/HDL Ratio: 3.4 ratio (ref 0.0–4.4)
LDL Chol Calc (NIH): 127 mg/dL — ABNORMAL HIGH (ref 0–99)
VLDL Cholesterol Cal: 16 mg/dL (ref 5–40)

## 2023-01-13 LAB — COMPREHENSIVE METABOLIC PANEL
ALT: 14 IU/L (ref 0–32)
Alkaline Phosphatase: 79 IU/L (ref 44–121)
BUN: 15 mg/dL (ref 6–24)
Bilirubin Total: 0.2 mg/dL (ref 0.0–1.2)
Chloride: 104 mmol/L (ref 96–106)
Globulin, Total: 2.9 g/dL (ref 1.5–4.5)
Glucose: 92 mg/dL (ref 70–99)
Potassium: 4.1 mmol/L (ref 3.5–5.2)
Sodium: 142 mmol/L (ref 134–144)
Total Protein: 7 g/dL (ref 6.0–8.5)
eGFR: 74 mL/min/{1.73_m2} (ref >59)

## 2023-01-13 LAB — HEPATITIS C ANTIBODY: Hep C Virus Ab: NONREACTIVE

## 2023-01-13 LAB — RPR+HIV+GC+CT PANEL: Neisseria Gonorrhoeae by PCR: NEGATIVE

## 2023-01-13 LAB — HEPATITIS B SURFACE ANTIGEN: Hepatitis B Surface Ag: NEGATIVE

## 2023-01-13 LAB — CBC
Hematocrit: 36.6 % (ref 34.0–46.6)
Hemoglobin: 11.4 g/dL (ref 11.1–15.9)
Platelets: 241 10*3/uL (ref 150–450)
RBC: 4.3 x10E6/uL (ref 3.77–5.28)
RDW: 14.2 % (ref 11.7–15.4)
WBC: 3.7 10*3/uL (ref 3.4–10.8)

## 2023-01-13 LAB — HEMOGLOBIN A1C
Est. average glucose Bld gHb Est-mCnc: 120 mg/dL
Hgb A1c MFr Bld: 5.8 % — ABNORMAL HIGH (ref 4.8–5.6)

## 2023-01-13 NOTE — Progress Notes (Signed)
 Results sent through MyChart

## 2023-01-20 ENCOUNTER — Other Ambulatory Visit: Payer: Self-pay | Admitting: Medical

## 2023-01-20 MED ORDER — PHENTERMINE HCL 37.5 MG PO TABS: ORAL_TABLET | ORAL | 0 refills | Status: DC

## 2023-02-07 ENCOUNTER — Telehealth: Payer: Self-pay | Admitting: Medical

## 2023-02-07 NOTE — Telephone Encounter (Signed)
P.A. QSYMIA 

## 2023-02-15 NOTE — Telephone Encounter (Signed)
P.A approved til 05/20/23, sent mychart message

## 2023-03-16 ENCOUNTER — Encounter: Payer: 59 | Admitting: Medical

## 2023-05-24 ENCOUNTER — Encounter: Payer: Self-pay | Admitting: Podiatry

## 2023-05-24 ENCOUNTER — Ambulatory Visit (INDEPENDENT_AMBULATORY_CARE_PROVIDER_SITE_OTHER): Payer: 59

## 2023-05-24 ENCOUNTER — Ambulatory Visit: Payer: 59 | Admitting: Podiatry

## 2023-05-24 DIAGNOSIS — M21612 Bunion of left foot: Secondary | ICD-10-CM

## 2023-05-24 DIAGNOSIS — M21611 Bunion of right foot: Secondary | ICD-10-CM

## 2023-05-24 DIAGNOSIS — L600 Ingrowing nail: Secondary | ICD-10-CM | POA: Diagnosis not present

## 2023-05-24 DIAGNOSIS — L84 Corns and callosities: Secondary | ICD-10-CM

## 2023-05-24 NOTE — Progress Notes (Signed)
  Subjective:  Patient ID: Abigail Duran, female    DOB: 05-30-1976,   MRN: [id number]  Chief Complaint  Patient presents with   Foot Pain    PT PRESENTS FOR BIL FOOT PAIN PT STATES SHE CAN HARDLY WALK SOMETIMES BECAUSE OF THE CALLOUSE ON THE BOTTOM OF HER RIGHT FOOT.   Bunions    47 y.o. female presents for multiple concerns including painful area in her right fourth and fifth toes. Also relates concern for painful bunions as well as possible ingrown toenails. Relates she has tried wider shoes but finds it difficult to find wide shoes. Relates she gets her nails trimmed and everynow and then they area painful . Denies any other pedal complaints. Denies n/v/f/c.   Past Medical History:  Diagnosis Date   Anemia    Anxiety    CIN I (cervical intraepithelial neoplasia I) 07/30/2010   Headache    History of PCOS 2008   History of varicella    Irregular periods/menstrual cycles 12/14/2006   LGSIL (low grade squamous intraepithelial lesion) on Pap smear 08/13/2009   colpo   Obesity    Pre-diabetes    Vitamin D deficiency    Wears glasses     Objective:  Physical Exam: Vascular: DP/PT pulses 2/4 bilateral. CFT <3 seconds. Normal hair growth on digits. No edema.  Skin. No lacerations or abrasions bilateral feet. Incurvation of bilateral hallux nails bilateral borders. Mildly tender. Hyperkeratotic tissue in sulcus and fourth interspace on right with mild maceration  Musculoskeletal: MMT 5/5 bilateral lower extremities in DF, PF, Inversion and Eversion. Deceased ROM in DF of ankle joint. Moderate HAV deformity noted bilateral. Minimal tenderness to palpation. No pain with ROM of the first MPJ. No hypermobility noted.  Neurological: Sensation intact to light touch.   Assessment:   1. Bilateral bunions   2. Heloma molle   3. Ingrown right greater toenail   4. Ingrown left greater toenail      Plan:  Patient was evaluated and treated and all questions answered. -Xrays reviewed./  Moderate HAV deformity noted bilateral.  -Discussed HAV, heloma molle and ingrown toenails.  and treatment options;conservative and surgical management; risks, benefits, alternatives discussed. All patient's questions answered. -Discussed padding and wide shoe gear.   -Recommend continue with good supportive shoes and inserts.  -Hyperkeratotic tissue debrided from fifth metatarsal as courtesy.  -Toe spacer and bunion pads provided.  -Advised use of betadine into interspace.  -Discussed possible removal of ingrown nails in the future patient will consider.  -Discussed surgical options.  -Patient to return to office as needed or sooner if condition worsens.    Louann Sjogren, DPM

## 2023-07-10 DIAGNOSIS — H524 Presbyopia: Secondary | ICD-10-CM | POA: Diagnosis not present

## 2023-07-10 DIAGNOSIS — H5213 Myopia, bilateral: Secondary | ICD-10-CM | POA: Diagnosis not present

## 2023-07-10 DIAGNOSIS — H52209 Unspecified astigmatism, unspecified eye: Secondary | ICD-10-CM | POA: Diagnosis not present

## 2023-07-31 HISTORY — PX: BUNIONECTOMY: SHX129

## 2023-08-05 ENCOUNTER — Ambulatory Visit: Payer: Commercial Managed Care - PPO | Admitting: Podiatry

## 2023-08-16 ENCOUNTER — Ambulatory Visit: Payer: Commercial Managed Care - PPO | Admitting: Medical

## 2023-08-16 ENCOUNTER — Other Ambulatory Visit (HOSPITAL_COMMUNITY): Payer: Self-pay

## 2023-08-16 VITALS — BP 136/80 | HR 95 | Ht 63.0 in | Wt 215.8 lb

## 2023-08-16 DIAGNOSIS — R5383 Other fatigue: Secondary | ICD-10-CM

## 2023-08-16 DIAGNOSIS — Z9884 Bariatric surgery status: Secondary | ICD-10-CM | POA: Diagnosis not present

## 2023-08-16 DIAGNOSIS — L709 Acne, unspecified: Secondary | ICD-10-CM | POA: Diagnosis not present

## 2023-08-16 DIAGNOSIS — E559 Vitamin D deficiency, unspecified: Secondary | ICD-10-CM | POA: Diagnosis not present

## 2023-08-16 DIAGNOSIS — R7303 Prediabetes: Secondary | ICD-10-CM | POA: Diagnosis not present

## 2023-08-16 DIAGNOSIS — Z6838 Body mass index (BMI) 38.0-38.9, adult: Secondary | ICD-10-CM

## 2023-08-16 DIAGNOSIS — D649 Anemia, unspecified: Secondary | ICD-10-CM

## 2023-08-16 MED ORDER — QSYMIA 7.5-46 MG PO CP24
1.0000 | ORAL_CAPSULE | Freq: Every day | ORAL | 1 refills | Status: DC
Start: 2023-08-16 — End: 2023-08-26
  Filled 2023-08-16 – 2023-08-18 (×2): qty 30, 30d supply, fill #0
  Filled 2023-08-26: qty 14, 14d supply, fill #0

## 2023-08-16 NOTE — Progress Notes (Signed)
Subjective:  Abigail Duran is a 48 y.o. female who presents for Chief Complaint  Patient presents with   Medical Management of Chronic Issues    Follow-up on weight loss medication. Been out of medication since 01/2023     Here for concerns about weight.  She is trying to lose weight.  She has gained some pounds back in recent months.  She was on phentermine prior.  She would like to go weight loss shot medication but insurance will not cover this  She would like to have some blood work including cortisol level.  She has noticed some acne, worse headaches, wakes up in the early hours of the morning and cannot get back to sleep.  She wonders about her cortisol levels being abnormal  She thinks she has a history of PCOS.  She has found it difficult to lose weight despite prior weight loss surgery  She is exercising walking treadmill.  She feels tired sometimes.  Drop her back on medication, weight loss efforts.  She has used a Psychologist, educational in the past but it got expensive  No other aggravating or relieving factors.    No other c/o.  Past Medical History:  Diagnosis Date   Anemia    Anxiety    CIN I (cervical intraepithelial neoplasia I) 07/30/2010   Headache    History of PCOS 2008   History of varicella    Irregular periods/menstrual cycles 12/14/2006   LGSIL (low grade squamous intraepithelial lesion) on Pap smear 08/13/2009   colpo   Obesity    Pre-diabetes    Vitamin D deficiency    Wears glasses    Current Outpatient Medications on File Prior to Visit  Medication Sig Dispense Refill   phentermine (ADIPEX-P) 37.5 MG tablet 1/2-1 tablet daily (Patient not taking: Reported on 08/16/2023) 30 tablet 0   No current facility-administered medications on file prior to visit.     The following portions of the patient's history were reviewed and updated as appropriate: allergies, current medications, past family history, past medical history, past social history, past surgical history  and problem list.  ROS Otherwise as in subjective above  Objective: BP 136/80   Pulse 95   Ht 5\' 3"  (1.6 m)   Wt 215 lb 12.8 oz (97.9 kg)   SpO2 100%   BMI 38.23 kg/m   General appearance: alert, no distress, well developed, well nourished Neck: supple, no lymphadenopathy, no thyromegaly, no masses Heart: RRR, normal S1, S2, no murmurs Lungs: CTA bilaterally, no wheezes, rhonchi, or rales Abdomen: +bs, soft, non tender, non distended, no masses, no hepatomegaly, no splenomegaly Pulses: 2+ radial pulses, 2+ pedal pulses, normal cap refill Ext: no edema   Assessment: Encounter Diagnoses  Name Primary?   Fatigue, unspecified type Yes   Pre-diabetes    Vitamin D deficiency    History of bariatric surgery    Anemia, unspecified type    Acne, unspecified acne type    BMI 38.0-38.9,adult      Plan: Return soon fasting in the morning for the labs below Counseled on diet, exercise, strategies with exercise and eating habits to lose weight.  Begin on trial of Qsymia medication.  Discussed risk and benefits and proper use of medication. Consider pelvic ultrasound to evaluation for PCOS I reviewed back her blood work from her physical last July 2024  Halynn was seen today for medical management of chronic issues.  Diagnoses and all orders for this visit:  Fatigue, unspecified type -  TSH; Future -     Hemoglobin A1c; Future -     Cortisol; Future  Pre-diabetes -     Hemoglobin A1c; Future  Vitamin D deficiency  History of bariatric surgery  Anemia, unspecified type  Acne, unspecified acne type -     Cortisol; Future  BMI 38.0-38.9,adult -     Cortisol; Future  Other orders -     Phentermine-Topiramate (QSYMIA) 7.5-46 MG CP24; Take 1 capsule by mouth daily.    Follow up: return for labs in the morning

## 2023-08-17 ENCOUNTER — Other Ambulatory Visit (HOSPITAL_COMMUNITY): Payer: Self-pay

## 2023-08-17 ENCOUNTER — Telehealth: Payer: Self-pay

## 2023-08-17 ENCOUNTER — Encounter (HOSPITAL_COMMUNITY): Payer: Self-pay

## 2023-08-17 NOTE — Telephone Encounter (Signed)
Pharmacy Patient Advocate Encounter   Received notification from CoverMyMeds that prior authorization for Qsymia 7.5-46MG  er capsules is required/requested.   Insurance verification completed.   The patient is insured through [facility] .   Per test claim: PA required; PA submitted to above mentioned insurance via CoverMyMeds Key/confirmation #/EOC (Key: [id number])  Status is pending

## 2023-08-18 ENCOUNTER — Other Ambulatory Visit (HOSPITAL_COMMUNITY): Payer: Self-pay

## 2023-08-18 NOTE — Telephone Encounter (Signed)
Pharmacy Patient Advocate Encounter   Received notification from [facility] that Prior Authorization for Qsymia 7.5mg -46mg  has been APPROVED from 3.2.25 to 5.31.25. Marland Kitchen  This test claim was processed through [facility]- copay amounts may vary at other pharmacies due to pharmacy/plan contracts, or as the patient moves through the different stages of their insurance plan.   PA #/Case ID/Reference #: (Key: [id number])

## 2023-08-24 ENCOUNTER — Other Ambulatory Visit: Payer: Commercial Managed Care - PPO

## 2023-08-26 ENCOUNTER — Other Ambulatory Visit: Payer: Self-pay | Admitting: Medical

## 2023-08-26 ENCOUNTER — Other Ambulatory Visit (HOSPITAL_COMMUNITY): Payer: Self-pay

## 2023-08-26 MED ORDER — QSYMIA 7.5-46 MG PO CP24
1.0000 | ORAL_CAPSULE | Freq: Every day | ORAL | 0 refills | Status: DC
Start: 2023-08-26 — End: 2024-01-21
  Filled 2023-08-26 – 2023-08-30 (×3): qty 30, 30d supply, fill #0

## 2023-08-27 DIAGNOSIS — M898X7 Other specified disorders of bone, ankle and foot: Secondary | ICD-10-CM | POA: Diagnosis not present

## 2023-08-27 DIAGNOSIS — M21611 Bunion of right foot: Secondary | ICD-10-CM | POA: Diagnosis not present

## 2023-08-30 ENCOUNTER — Other Ambulatory Visit (HOSPITAL_COMMUNITY): Payer: Self-pay

## 2023-09-03 DIAGNOSIS — Z4889 Encounter for other specified surgical aftercare: Secondary | ICD-10-CM | POA: Diagnosis not present

## 2023-09-09 ENCOUNTER — Other Ambulatory Visit (HOSPITAL_COMMUNITY): Payer: Self-pay

## 2023-10-05 DIAGNOSIS — Z4889 Encounter for other specified surgical aftercare: Secondary | ICD-10-CM | POA: Diagnosis not present

## 2023-10-10 ENCOUNTER — Emergency Department (HOSPITAL_BASED_OUTPATIENT_CLINIC_OR_DEPARTMENT_OTHER)

## 2023-10-10 ENCOUNTER — Encounter (HOSPITAL_BASED_OUTPATIENT_CLINIC_OR_DEPARTMENT_OTHER): Payer: Self-pay | Admitting: Emergency Medicine

## 2023-10-10 ENCOUNTER — Emergency Department (HOSPITAL_BASED_OUTPATIENT_CLINIC_OR_DEPARTMENT_OTHER)
Admission: EM | Admit: 2023-10-10 | Discharge: 2023-10-11 | Disposition: A | Discharge status: Home / Self Care | Attending: Emergency Medicine | Admitting: Emergency Medicine

## 2023-10-10 DIAGNOSIS — R0789 Other chest pain: Secondary | ICD-10-CM | POA: Diagnosis not present

## 2023-10-10 DIAGNOSIS — R079 Chest pain, unspecified: Secondary | ICD-10-CM | POA: Diagnosis not present

## 2023-10-10 DIAGNOSIS — J40 Bronchitis, not specified as acute or chronic: Secondary | ICD-10-CM | POA: Diagnosis not present

## 2023-10-10 DIAGNOSIS — R0602 Shortness of breath: Secondary | ICD-10-CM | POA: Diagnosis not present

## 2023-10-10 LAB — CBC WITH DIFFERENTIAL/PLATELET
Abs Immature Granulocytes: 0.01 10*3/uL (ref 0.00–0.07)
Basophils Absolute: 0 10*3/uL (ref 0.0–0.1)
Basophils Relative: 1 %
Eosinophils Absolute: 0.1 10*3/uL (ref 0.0–0.5)
Eosinophils Relative: 3 %
HCT: 34.7 % — ABNORMAL LOW (ref 36.0–46.0)
Hemoglobin: 11.3 g/dL — ABNORMAL LOW (ref 12.0–15.0)
Immature Granulocytes: 0 %
Lymphocytes Relative: 33 %
Lymphs Abs: 1.7 10*3/uL (ref 0.7–4.0)
MCH: 26.9 pg (ref 26.0–34.0)
MCHC: 32.6 g/dL (ref 30.0–36.0)
MCV: 82.6 fL (ref 80.0–100.0)
Monocytes Absolute: 0.6 10*3/uL (ref 0.1–1.0)
Monocytes Relative: 11 %
Neutro Abs: 2.8 10*3/uL (ref 1.7–7.7)
Neutrophils Relative %: 52 %
Platelets: 233 10*3/uL (ref 150–400)
RBC: 4.2 MIL/uL (ref 3.87–5.11)
RDW: 14.8 % (ref 11.5–15.5)
WBC: 5.2 10*3/uL (ref 4.0–10.5)
nRBC: 0 % (ref 0.0–0.2)

## 2023-10-10 MED ORDER — ACETAMINOPHEN 325 MG PO TABS
650.0000 mg | ORAL_TABLET | Freq: Once | ORAL | Status: AC
  Administered 2023-10-11: 650 mg via ORAL
  Filled 2023-10-10: qty 2

## 2023-10-10 MED ORDER — IPRATROPIUM-ALBUTEROL 0.5-2.5 (3) MG/3ML IN SOLN
3.0000 mL | Freq: Once | RESPIRATORY_TRACT | Status: AC
  Administered 2023-10-11: 3 mL via RESPIRATORY_TRACT
  Filled 2023-10-10: qty 3

## 2023-10-10 MED ORDER — IBUPROFEN 800 MG PO TABS
800.0000 mg | ORAL_TABLET | Freq: Once | ORAL | Status: AC
  Administered 2023-10-11: 800 mg via ORAL
  Filled 2023-10-10: qty 1

## 2023-10-10 NOTE — ED Notes (Signed)
 Pt seen in triage by this RT for SOB, BLB dim no wheezes  noted shallow breaths. SATS 100% on room air. No distress noted

## 2023-10-10 NOTE — ED Triage Notes (Signed)
 Pt coming in POV for chest pain that radiates to the back with SOB. Pt states it started on Wednesday but worsened last night into today.

## 2023-10-10 NOTE — ED Provider Notes (Signed)
 [place] EMERGENCY DEPARTMENT AT [facility] Provider Note   CSN: [id number] Arrival date & time: 10/10/23  2326     History {Add pertinent medical, surgical, social history, OB history to HPI:1} Chief Complaint  Patient presents with   Chest Pain    Abigail Duran is a 48 y.o. female.  Patient with a history of anemia, anxiety, irregular menses here with 4 days of bodyaches, shortness of breath, cough productive of green and yellow mucus, chest pain with coughing.  Symptoms started 4 days ago, seem to improve and then became worse again over the past 2 days.  She feels short of breath has central chest pain with coughing and does not radiate to her arms, neck or back.  Cough productive of green and yellow mucus with some nasal congestion.  No significant sore throat or headache.  No abdominal pain, nausea, vomiting, diarrhea, pain with urination or blood in the urine.  Had foot surgery at the end of February has been on aspirin.  She was recently had a postop appointment and will issue is exposed to several sick people. Subjective chills and fever at home has not checked her temperature. No history of asthma.  Does not smoke.  No cardiac history.  The history is provided by the patient.  Chest Pain Associated symptoms: cough, fatigue, fever, headache, shortness of breath and weakness   Associated symptoms: no abdominal pain, no nausea and no vomiting        Home Medications Prior to Admission medications   Medication Sig Start Date End Date Taking? Authorizing Provider  phentermine (ADIPEX-P) 37.5 MG tablet 1/2-1 tablet daily Patient not taking: Reported on 08/16/2023 01/20/23   Tysinger, Christiane Cowing, PA-C  Phentermine-Topiramate (QSYMIA) 7.5-46 MG CP24 Take 1 capsule by mouth daily. 08/26/23   Tysinger, Christiane Cowing, PA-C      Allergies    Codeine, Elemental sulfur, and Metformin and related    Review of Systems   Review of Systems  Constitutional:  Positive for fatigue and  fever. Negative for activity change and appetite change.  HENT:  Positive for congestion. Negative for sore throat.   Respiratory:  Positive for cough, chest tightness and shortness of breath.   Cardiovascular:  Positive for chest pain.  Gastrointestinal:  Negative for abdominal pain, nausea and vomiting.  Genitourinary:  Negative for dysuria and hematuria.  Musculoskeletal:  Positive for arthralgias and myalgias.  Skin:  Negative for rash.  Neurological:  Positive for weakness and headaches.   all other systems are negative except as noted in the HPI and PMH.    Physical Exam Updated Vital Signs BP (!) 156/68   Pulse (!) 105   Temp 98.9 F (37.2 C) (Oral)   Resp 18   Ht 5\' 3"  (1.6 m)   Wt 95.3 kg   LMP 09/15/2023 (Approximate)   SpO2 100%   BMI 37.20 kg/m  Physical Exam Vitals and nursing note reviewed.  Constitutional:      General: She is not in acute distress.    Appearance: She is well-developed.     Comments: Marijuana smell in room Speaking full sentences  HENT:     Head: Normocephalic and atraumatic.     Mouth/Throat:     Pharynx: No oropharyngeal exudate.  Eyes:     Conjunctiva/sclera: Conjunctivae normal.     Pupils: Pupils are equal, round, and reactive to light.  Neck:     Comments: No meningismus. Cardiovascular:     Rate and Rhythm: Normal  rate and regular rhythm.     Heart sounds: Normal heart sounds. No murmur heard. Pulmonary:     Effort: Pulmonary effort is normal. No respiratory distress.     Breath sounds: Normal breath sounds.     Comments: Diminished without wheezing Abdominal:     Palpations: Abdomen is soft.     Tenderness: There is no abdominal tenderness. There is no guarding or rebound.  Musculoskeletal:        General: No tenderness. Normal range of motion.     Cervical back: Normal range of motion and neck supple.     Comments:  Postop shoe right foot  Skin:    General: Skin is warm.  Neurological:     Mental Status: She is alert  and oriented to person, place, and time.     Cranial Nerves: No cranial nerve deficit.     Motor: No abnormal muscle tone.     Coordination: Coordination normal.     Comments:  5/5 strength throughout. CN 2-12 intact.Equal grip strength.   Psychiatric:        Behavior: Behavior normal.     ED Results / Procedures / Treatments   Labs (all labs ordered are listed, but only abnormal results are displayed) Labs Reviewed  CBC WITH DIFFERENTIAL/PLATELET - Abnormal; Notable for the following components:      Result Value   Hemoglobin 11.3 (*)    HCT 34.7 (*)    All other components within normal limits  RESP PANEL BY RT-PCR (RSV, FLU A&B, COVID)  RVPGX2  PREGNANCY, URINE  D-DIMER, QUANTITATIVE  COMPREHENSIVE METABOLIC PANEL WITH GFR  TROPONIN I (HIGH SENSITIVITY)    EKG EKG Interpretation Date/Time:  Sunday October 10 2023 23:38:32 EDT Ventricular Rate:  94 PR Interval:  129 QRS Duration:  80 QT Interval:  351 QTC Calculation: 439 R Axis:   12  Text Interpretation: Sinus rhythm Probable left atrial enlargement Low voltage, precordial leads No significant change was found Confirmed by Earma Gloss [phone]) on 10/10/2023 11:44:13 PM  Radiology No results found.  Procedures Procedures  {Document cardiac monitor, telemetry assessment procedure when appropriate:1}  Medications Ordered in ED Medications - No data to display  ED Course/ Medical Decision Making/ A&P   {   Click here for ABCD2, HEART and other calculatorsREFRESH Note before signing :1}                              Medical Decision Making Amount and/or Complexity of Data Reviewed Labs: ordered. Decision-making details documented in ED Course. Radiology: ordered and independent interpretation performed. Decision-making details documented in ED Course. ECG/medicine tests: ordered and independent interpretation performed. Decision-making details documented in ED Course.   4 days of bodyaches, chills, chest  tightness with coughing, productive cough, shortness of breath.  No distress on arrival.  Diminished breath sounds with O2 saturation 100%.  EKG is sinus rhythm.  No acute ST changes.  Low suspicion for ACS.  Obtain screening labs including D-dimer given her recent foot surgery.  Check chest x-ray and viral swabs.  {Document critical care time when appropriate:1} {Document review of labs and clinical decision tools ie heart score, Chads2Vasc2 etc:1}  {Document your independent review of radiology images, and any outside records:1} {Document your discussion with family members, caretakers, and with consultants:1} {Document social determinants of health affecting pt's care:1} {Document your decision making why or why not admission, treatments were needed:1} Final Clinical Impression(s) /  ED Diagnoses Final diagnoses:  None    Rx / DC Orders ED Discharge Orders     None

## 2023-10-11 ENCOUNTER — Emergency Department (HOSPITAL_BASED_OUTPATIENT_CLINIC_OR_DEPARTMENT_OTHER)

## 2023-10-11 DIAGNOSIS — R079 Chest pain, unspecified: Secondary | ICD-10-CM | POA: Diagnosis not present

## 2023-10-11 LAB — COMPREHENSIVE METABOLIC PANEL WITH GFR
ALT: 11 U/L (ref 0–44)
AST: 17 U/L (ref 15–41)
Albumin: 4 g/dL (ref 3.5–5.0)
Alkaline Phosphatase: 67 U/L (ref 38–126)
Anion gap: 8 (ref 5–15)
BUN: 12 mg/dL (ref 6–20)
CO2: 26 mmol/L (ref 22–32)
Calcium: 8.7 mg/dL — ABNORMAL LOW (ref 8.9–10.3)
Chloride: 102 mmol/L (ref 98–111)
Creatinine, Ser: 0.87 mg/dL (ref 0.44–1.00)
GFR, Estimated: >60 mL/min (ref >60)
Glucose, Bld: 92 mg/dL (ref 70–99)
Potassium: 4 mmol/L (ref 3.5–5.1)
Sodium: 136 mmol/L (ref 135–145)
Total Bilirubin: 0.3 mg/dL (ref 0.0–1.2)
Total Protein: 6.8 g/dL (ref 6.5–8.1)

## 2023-10-11 LAB — D-DIMER, QUANTITATIVE: D-Dimer, Quant: 0.65 ug{FEU}/mL — ABNORMAL HIGH (ref 0.00–0.50)

## 2023-10-11 LAB — TROPONIN I (HIGH SENSITIVITY)
Troponin I (High Sensitivity): 3 ng/L (ref <18)
Troponin I (High Sensitivity): 3 ng/L (ref <18)

## 2023-10-11 LAB — PREGNANCY, URINE: Preg Test, Ur: NEGATIVE

## 2023-10-11 LAB — RESP PANEL BY RT-PCR (RSV, FLU A&B, COVID)  RVPGX2
Influenza A by PCR: NEGATIVE
Influenza B by PCR: NEGATIVE
Resp Syncytial Virus by PCR: NEGATIVE
SARS Coronavirus 2 by RT PCR: NEGATIVE

## 2023-10-11 MED ORDER — IOHEXOL 350 MG/ML SOLN
75.0000 mL | Freq: Once | INTRAVENOUS | Status: AC | PRN
  Administered 2023-10-11: 75 mL via INTRAVENOUS

## 2023-10-11 MED ORDER — DOXYCYCLINE HYCLATE 100 MG PO CAPS: 100.0000 mg | ORAL_CAPSULE | Freq: Two times a day (BID) | ORAL | 0 refills | Status: DC

## 2023-10-11 MED ORDER — ALBUTEROL SULFATE HFA 108 (90 BASE) MCG/ACT IN AERS
1.0000 | INHALATION_SPRAY | Freq: Four times a day (QID) | RESPIRATORY_TRACT | 0 refills | Status: DC | PRN
Start: 2023-10-11 — End: 2024-01-21

## 2023-10-11 MED ORDER — FLUTICASONE PROPIONATE 50 MCG/ACT NA SUSP: 1.0000 | Freq: Every day | NASAL | 0 refills | Status: DC

## 2023-10-11 NOTE — ED Notes (Signed)
 Ambulated with pulse ox - oxygen sats remained 98-100% patient tolerated well without labored breathing pattern

## 2023-10-11 NOTE — Discharge Instructions (Signed)
 No evidence of heart attack or blood clot in the lung.  Take the antibiotics as prescribed for likely bronchitis.  Use Tylenol or Motrin as needed for aches and for fever.  Follow-up with your doctor for recheck this week.  Return to the ED with new or worsening symptoms. Return to the ED with exertional chest pain, pain associate with shortness of breath, nausea, vomiting, sweating or other concerns.

## 2023-10-12 ENCOUNTER — Telehealth: Admitting: Physician Assistant

## 2023-10-12 DIAGNOSIS — T3695XA Adverse effect of unspecified systemic antibiotic, initial encounter: Secondary | ICD-10-CM

## 2023-10-12 DIAGNOSIS — B379 Candidiasis, unspecified: Secondary | ICD-10-CM

## 2023-10-12 MED ORDER — FLUCONAZOLE 150 MG PO TABS: ORAL_TABLET | ORAL | 0 refills | Status: DC

## 2023-10-12 NOTE — Progress Notes (Signed)

## 2023-10-12 NOTE — Progress Notes (Signed)
 I have spent 5 minutes in review of e-visit questionnaire, review and updating patient chart, medical decision making and response to patient.   Piedad Climes, PA-C

## 2023-12-30 ENCOUNTER — Encounter (HOSPITAL_COMMUNITY): Payer: Self-pay | Admitting: *Deleted

## 2024-01-21 ENCOUNTER — Encounter: Payer: Self-pay | Admitting: Medical

## 2024-01-21 ENCOUNTER — Ambulatory Visit (INDEPENDENT_AMBULATORY_CARE_PROVIDER_SITE_OTHER): Admitting: Medical

## 2024-01-21 VITALS — BP 136/72 | HR 90 | Ht 63.5 in | Wt 205.6 lb

## 2024-01-21 DIAGNOSIS — Z136 Encounter for screening for cardiovascular disorders: Secondary | ICD-10-CM

## 2024-01-21 DIAGNOSIS — Z7185 Encounter for immunization safety counseling: Secondary | ICD-10-CM

## 2024-01-21 DIAGNOSIS — Z23 Encounter for immunization: Secondary | ICD-10-CM | POA: Diagnosis not present

## 2024-01-21 DIAGNOSIS — Z1389 Encounter for screening for other disorder: Secondary | ICD-10-CM | POA: Diagnosis not present

## 2024-01-21 DIAGNOSIS — E538 Deficiency of other specified B group vitamins: Secondary | ICD-10-CM

## 2024-01-21 DIAGNOSIS — R5383 Other fatigue: Secondary | ICD-10-CM

## 2024-01-21 DIAGNOSIS — Z13220 Encounter for screening for lipoid disorders: Secondary | ICD-10-CM | POA: Diagnosis not present

## 2024-01-21 DIAGNOSIS — E559 Vitamin D deficiency, unspecified: Secondary | ICD-10-CM | POA: Diagnosis not present

## 2024-01-21 DIAGNOSIS — Z113 Encounter for screening for infections with a predominantly sexual mode of transmission: Secondary | ICD-10-CM

## 2024-01-21 DIAGNOSIS — Z0000 Encounter for general adult medical examination without abnormal findings: Secondary | ICD-10-CM

## 2024-01-21 DIAGNOSIS — Z6835 Body mass index (BMI) 35.0-35.9, adult: Secondary | ICD-10-CM

## 2024-01-21 DIAGNOSIS — Z1211 Encounter for screening for malignant neoplasm of colon: Secondary | ICD-10-CM

## 2024-01-21 DIAGNOSIS — Z1231 Encounter for screening mammogram for malignant neoplasm of breast: Secondary | ICD-10-CM

## 2024-01-21 LAB — HEMOGLOBIN A1C
Est. average glucose Bld gHb Est-mCnc: 123 mg/dL
Hgb A1c MFr Bld: 5.9 % — ABNORMAL HIGH (ref 4.8–5.6)

## 2024-01-21 MED ORDER — CYANOCOBALAMIN 1000 MCG/ML IJ SOLN
1000.0000 ug | Freq: Once | INTRAMUSCULAR | Status: AC
  Administered 2024-01-21: 1000 ug via INTRAMUSCULAR

## 2024-01-21 MED ORDER — PHENTERMINE HCL 37.5 MG PO TABS: 37.5000 mg | ORAL_TABLET | Freq: Every day | ORAL | 0 refills | Status: AC

## 2024-01-21 NOTE — Patient Instructions (Signed)
 Check to see pricing on Contrave  weight loss medication.

## 2024-01-21 NOTE — Progress Notes (Signed)
 Subjective: Chief Complaint  Patient presents with   Annual Exam    Fasting-   Medical Team: Dr. Geni Shutter, weight management clinic Dentist, Dr. Brenita Dr. Glendia, eye doctor Devantae Babe, Alm RAMAN, PA-C here for primary care   Concerns: Last 3 weeks no energy.  Would like a B12 shot.  Wants vitamin levels checked.  Periods are regular except last this month but started this morning.   Not wanting birth control.  No prior pregnancy.    Would like to get back on phentermine  to help her weight loss.  She is trying to work on losing weight.  She has seen healthy weight wellness in the past but it got too expensive.  Reviewed their medical, surgical, family, social, medication, and allergy history and updated chart as appropriate.  Allergies  Allergen Reactions   Codeine Nausea And Vomiting   Elemental Sulfur Nausea Only   Metformin  And Related     Nausea and GI upset    Past Medical History:  Diagnosis Date   Anemia    Anxiety    CIN I (cervical intraepithelial neoplasia I) 07/30/2010   Headache    History of PCOS 2008   History of varicella    Irregular periods/menstrual cycles 12/14/2006   LGSIL (low grade squamous intraepithelial lesion) on Pap smear 08/13/2009   colpo   Obesity    Pre-diabetes    Vitamin D  deficiency    Wears glasses     No current outpatient medications on file prior to visit.   No current facility-administered medications on file prior to visit.      Current Outpatient Medications:    phentermine  (ADIPEX-P ) 37.5 MG tablet, Take 1 tablet (37.5 mg total) by mouth daily before breakfast., Disp: 30 tablet, Rfl: 0  Family History  Problem Relation Age of Onset   Diabetes Mother    Asthma Mother    Depression Mother    Hypertension Mother    Other Mother        back surgery   COPD Mother    Anxiety disorder Mother    Alcohol abuse Father    Early death Father    Hypertension Father    Cirrhosis Father        died of cirrhosis    Diabetes Sister    Lupus Sister    Sickle cell trait Brother    Hypertension Brother    Cancer Maternal Aunt        brain tumor   Heart disease Maternal Grandmother    Stroke Neg Hx     Past Surgical History:  Procedure Laterality Date   BUNIONECTOMY Right 07/2023   COLPOSCOPY  2010   LAPAROSCOPIC GASTRIC SLEEVE RESECTION N/A 05/31/2017   Procedure: LAPAROSCOPIC GASTRIC SLEEVE RESECTION, UPPER ENDOSCOPY;  Surgeon: Stevie, Herlene Righter, MD;  Location: [facility];  Service: General;  Laterality: N/A;    Review of Systems  Constitutional:  Positive for malaise/fatigue. Negative for chills, fever and weight loss.  HENT:  Negative for congestion, ear pain, hearing loss, sore throat and tinnitus.   Eyes:  Negative for blurred vision, pain and redness.  Respiratory:  Negative for cough, hemoptysis and shortness of breath.   Cardiovascular:  Negative for chest pain, palpitations, orthopnea, claudication and leg swelling.  Gastrointestinal:  Negative for abdominal pain, blood in stool, constipation, diarrhea, nausea and vomiting.  Genitourinary:  Negative for dysuria, flank pain, frequency, hematuria and urgency.  Musculoskeletal:  Negative for falls, joint pain and myalgias.  Skin:  Negative  for itching and rash.  Neurological:  Negative for dizziness, tingling, speech change, weakness and headaches.  Endo/Heme/Allergies:  Negative for polydipsia. Does not bruise/bleed easily.  Psychiatric/Behavioral:  Negative for depression and memory loss. The patient is not nervous/anxious and does not have insomnia.        Objective:  BP 136/72   Pulse 90   Ht 5' 3.5 (1.613 m)   Wt 205 lb 9.6 oz (93.3 kg)   LMP 01/21/2024   SpO2 95%   BMI 35.85 kg/m   Wt Readings from Last 3 Encounters:  01/21/24 205 lb 9.6 oz (93.3 kg)  10/10/23 210 lb (95.3 kg)  08/16/23 215 lb 12.8 oz (97.9 kg)   BP Readings from Last 3 Encounters:  01/21/24 136/72  10/11/23 132/81  08/16/23 136/80    General  appearance: alert, no distress, WD/WN, African American female Skin:  unremarkable, tattoo mid upper back of a dancer, tattoo of sister on left anterior distal forearm HEENT: normocephalic, conjunctiva/corneas normal, sclerae anicteric, PERRLA, EOMi, nares patent, no discharge or erythema, pharynx normal Oral cavity: MMM, tongue normal, teeth normal Neck: supple, no lymphadenopathy, no thyromegaly, no masses, normal ROM, no bruits Chest: non tender, normal shape and expansion Heart: RRR, normal S1, S2, no murmurs Lungs: CTA bilaterally, no wheezes, rhonchi, or rales Abdomen: +bs, soft, non tender, non distended, no masses, no hepatomegaly, no splenomegaly, no bruits Back: non tender, normal ROM, no scoliosis Musculoskeletal: upper extremities non tender, no obvious deformity, normal ROM throughout, lower extremities non tender, no obvious deformity, normal ROM throughout Extremities: no edema, no cyanosis, no clubbing Pulses: 2+ symmetric, upper and lower extremities, normal cap refill Neurological: alert, oriented x 3, CN2-12 intact, strength normal upper extremities and lower extremities, sensation normal throughout, DTRs 2+ throughout, no cerebellar signs, gait normal Psychiatric: normal affect, behavior normal, pleasant  Breast: nontender, no masses or lumps, no skin changes, no nipple discharge or inversion, no axillary lymphadenopathy Gyn/rectal - deferred     Assessment and Plan :   Encounter Diagnoses  Name Primary?   Encounter for health maintenance examination in adult Yes   Vaccine counseling    Screening for hematuria or proteinuria    Encounter for lipid screening for cardiovascular disease    Screen for STD (sexually transmitted disease)    Vitamin D  deficiency    Other fatigue    BMI 35.0-35.9,adult    Screening mammogram for breast cancer    Screening for colon cancer    Need for Tdap vaccination    B12 deficiency      This visit was a preventative care  visit, also known as wellness visit or routine physical.   Topics typically include healthy lifestyle, diet, exercise, preventative care, vaccinations, sick and well care, proper use of emergency dept and after hours care, as well as other concerns.    Separate significant issues discussed: Weight management Work on getting weight bearing and aerobic exercise Make sure aerobic exercise is moderate intensity, 45-60 minutes at least 4 days per week Eat a good variety of fruits, vegetables and whole grains Begin trial of short term phentermine  every other day.    Discussed risks/benefits of medication. Consider using My Fitness pal app, limiting calories to 1500 per day, and set regular goals that are within reach  fatigue - labs today, B12 injection at her request today   General Recommendations: Continue to return yearly for your annual wellness and preventative care visits.  This gives us  a chance to discuss healthy  lifestyle, exercise, vaccinations, review your chart record, and perform screenings where appropriate.  I recommend you see your eye doctor yearly for routine vision care.  I recommend you see your dentist yearly for routine dental care including hygiene visits twice yearly.   Vaccination recommendations were reviewed Immunization History  Administered Date(s) Administered   Influenza,inj,Quad PF,6+ Mos 03/01/2019, 04/08/2020   Influenza-Unspecified 04/10/2014, 04/03/2015   Moderna Sars-Covid-2 Vaccination 06/27/2019, 01/24/2020   Tdap 07/19/2013, 01/21/2024    Vaccine recommendations: Yearly flu shot  Counseled on the Tdap (tetanus, diptheria, and acellular pertussis) vaccine.  Vaccine information sheet given. Tdap vaccine given after consent obtained.     Screening for cancer: Colon cancer screening: Discussed Cologuard vs colonoscopy. Cologuard referral placed.   Breast cancer screening: You should perform a self breast exam monthly.   We reviewed  recommendations for regular mammograms and breast cancer screening. Last mammogram: 12/2021.  Cervical cancer screening: We reviewed recommendations for pap smear screening. Last pap 2022 reviewed   Skin cancer screening: Check your skin regularly for new changes, growing lesions, or other lesions of concern Come in for evaluation if you have skin lesions of concern.  Lung cancer screening: If you have a greater than 20 pack year history of tobacco use, then you may qualify for lung cancer screening with a chest CT scan.   Please call your insurance company to inquire about coverage for this test.  Pancreatic cancer: no current screening test is available routinely recommended.  (Risk factors: Smoking, overweight or obese, diabetes, chronic pancreatitis, work Nurse, mental health, Solicitor, 81 year old or greater, female greater than female, African-American, family history of pancreatic cancer, hereditary breast, ovarian, melanoma, Lynch, Peutz-jeghers).  We currently don't have screenings for other cancers besides breast, cervical, colon, and lung cancers.  If you have a strong family history of cancer or have other cancer screening concerns, please let me know.    Bone health: Get at least 150 minutes of aerobic exercise weekly Get weight bearing exercise at least once weekly Bone density test:  A bone density test is an imaging test that uses a type of X-ray to measure the amount of calcium and other minerals in your bones. The test may be used to diagnose or screen you for a condition that causes weak or thin bones (osteoporosis), predict your risk for a broken bone (fracture), or determine how well your osteoporosis treatment is working. The bone density test is recommended for females 65 and older, or females or males <65 if certain risk factors such as thyroid disease, long term use of steroids such as for asthma or rheumatological issues, vitamin D  deficiency, estrogen deficiency,  family history of osteoporosis, self or family history of fragility fracture in first degree relative.    Heart health: Get at least 150 minutes of aerobic exercise weekly Limit alcohol It is important to maintain a healthy blood pressure and healthy cholesterol numbers  Heart disease screening: Screening for heart disease includes screening for blood pressure, fasting lipids, glucose/diabetes screening, BMI height to weight ratio, reviewed of smoking status, physical activity, and diet.    Goals include blood pressure 120/80 or less, maintaining a healthy lipid/cholesterol profile, preventing diabetes or keeping diabetes numbers under good control, not smoking or using tobacco products, exercising most days per week or at least 150 minutes per week of exercise, and eating healthy variety of fruits and vegetables, healthy oils, and avoiding unhealthy food choices like fried food, fast food, high sugar and high cholesterol foods.  Other tests may possibly include EKG test, CT coronary calcium score, echocardiogram, exercise treadmill stress test.   Reviewed EKG on file from 2022   Vascular disease screening: For high risk individuals including smokers, diabetes, patients with known heart disease or high blood pressure, kidney disease, and others, screening for vascular disease or atherosclerosis of the arteries is available.  Examples may include carotid ultrasound, abdominal aortic ultrasound, ABI blood flow screening in the legs, thoracic aorta screening.   Medical care options: I recommend you continue to seek care here first for routine care.  We try really hard to have available appointments Monday through Friday daytime hours for sick visits, acute visits, and physicals.  Urgent care should be used for after hours and weekends for significant issues that cannot wait till the next day.  The emergency department should be used for significant potentially life-threatening emergencies.   The emergency department is expensive, can often have long wait times for less significant concerns, so try to utilize primary care, urgent care, or telemedicine when possible to avoid unnecessary trips to the emergency department.  Virtual visits and telemedicine have been introduced since the pandemic started in 2020, and can be convenient ways to receive medical care.  We offer virtual appointments as well to assist you in a variety of options to seek medical care.   Legal Take the time to do a Last Will and Testament, advanced directives including Healthcare Power of Attorney and Living Will documents.  Do not leave your family with burdens that can be handled ahead of time.   Advanced Directives: I recommend you consider completing a Health Care Power of Attorney and Living Will.   These documents respect your wishes and help alleviate burdens on your loved ones if you were to become terminally ill or be in a position to need those documents enforced.    You can complete Advanced Directives yourself, have them notarized, then have copies made for our office, for you and for anybody you feel should have them in safe keeping.  Or, you can have an attorney prepare these documents.   If you haven't updated your Last Will and Testament in a while, it may be worthwhile having an attorney prepare these documents together and save on some costs.       Spiritual and Emotional Health Keeping a healthy spiritual life can help you better manage your physical health. Your spiritual life can help you to cope with any issues that may arise with your physical health.  Balance can keep us  healthy and help us  to recover.  If you are struggling with your spiritual health there are questions that you may want to ask yourself:  What makes me feel most complete? When do I feel most connected to the rest of the world? Where do I find the most inner strength? What am I doing when I feel whole?  Helpful  tips: Being in nature. Some people feel very connected and at peace when they are walking outdoors or are outside. Helping others. Some feel the largest sense of wellbeing when they are of service to others. Being of service can take on many forms. It can be doing volunteer work, being kind to strangers, or offering a hand to a friend in need. Gratitude. Some people find they feel the most connected when they remain grateful. They may make lists of all the things they are grateful for or say a thank you out loud for all they have.  Emotional Health Are you in tune with your emotional health?  Check out this link: http://www.marquez-love.com/   Financial Health Make sure you use a budget for your personal finances Make sure you are insured against risks (health insurance, life insurance, auto insurance, etc) Save more, spend less Set financial goals If you need help in this area, good resources include counseling through [employer] or other community resources, have a meeting with a Social research officer, government, and a good resource is [employer] Shoulder podcast   Abigail Duran was seen today for annual exam.  Diagnoses and all orders for this visit:  Encounter for health maintenance examination in adult -     CBC -     Comprehensive metabolic panel with GFR -     Lipid panel -     TSH + free T4 -     Vitamin B12 -     VITAMIN D  25 Hydroxy (Vit-D Deficiency, Fractures) -     Cancel: Urinalysis, Routine w reflex microscopic -     HIV Antibody (routine testing w rflx) -     RPR -     Chlamydia/Gonococcus/Trichomonas, NAA -     Hemoglobin A1c -     MM 3D SCREENING MAMMOGRAM BILATERAL BREAST -     Cologuard  Vaccine counseling  Screening for hematuria or proteinuria -     Cancel: Urinalysis, Routine w reflex microscopic  Encounter for lipid screening for cardiovascular disease -     Lipid panel  Screen for STD (sexually transmitted disease) -     HIV Antibody (routine testing w  rflx) -     RPR -     Chlamydia/Gonococcus/Trichomonas, NAA  Vitamin D  deficiency -     VITAMIN D  25 Hydroxy (Vit-D Deficiency, Fractures)  Other fatigue -     TSH + free T4 -     Vitamin B12 -     VITAMIN D  25 Hydroxy (Vit-D Deficiency, Fractures)  BMI 35.0-35.9,adult -     TSH + free T4 -     Hemoglobin A1c  Screening mammogram for breast cancer -     MM 3D SCREENING MAMMOGRAM BILATERAL BREAST  Screening for colon cancer -     Cologuard  Need for Tdap vaccination -     Cancel: Tdap vaccine greater than or equal to 7yo IM -     Tdap vaccine greater than or equal to 7yo IM  B12 deficiency -     cyanocobalamin (VITAMIN B12) injection 1,000 mcg  Other orders -     phentermine  (ADIPEX-P ) 37.5 MG tablet; Take 1 tablet (37.5 mg total) by mouth daily before breakfast.     Follow-up pending labs, yearly for physical

## 2024-01-22 LAB — LIPID PANEL

## 2024-01-25 ENCOUNTER — Ambulatory Visit: Payer: Self-pay | Admitting: Medical

## 2024-01-25 ENCOUNTER — Other Ambulatory Visit: Payer: Self-pay | Admitting: Medical

## 2024-01-25 DIAGNOSIS — D649 Anemia, unspecified: Secondary | ICD-10-CM

## 2024-01-25 DIAGNOSIS — Z1211 Encounter for screening for malignant neoplasm of colon: Secondary | ICD-10-CM

## 2024-01-25 DIAGNOSIS — D509 Iron deficiency anemia, unspecified: Secondary | ICD-10-CM

## 2024-01-25 LAB — VITAMIN B12: Vitamin B-12: 408 pg/mL (ref 232–1245)

## 2024-01-25 LAB — TSH+FREE T4
Free T4: 1.02 ng/dL (ref 0.82–1.77)
TSH: 2.19 u[IU]/mL (ref 0.450–4.500)

## 2024-01-25 LAB — CBC
Hematocrit: 34.6 % (ref 34.0–46.6)
Hemoglobin: 10.7 g/dL — ABNORMAL LOW (ref 11.1–15.9)
MCH: 26.8 pg (ref 26.6–33.0)
MCHC: 30.9 g/dL — ABNORMAL LOW (ref 31.5–35.7)
MCV: 87 fL (ref 79–97)
Platelets: 257 x10E3/uL (ref 150–450)
RBC: 4 x10E6/uL (ref 3.77–5.28)
RDW: 15.8 % — ABNORMAL HIGH (ref 11.7–15.4)
WBC: 4.9 x10E3/uL (ref 3.4–10.8)

## 2024-01-25 LAB — COMPREHENSIVE METABOLIC PANEL WITH GFR
ALT: 12 IU/L (ref 0–32)
AST: 15 IU/L (ref 0–40)
Albumin: 3.8 g/dL — AB (ref 3.9–4.9)
Alkaline Phosphatase: 78 IU/L (ref 44–121)
BUN/Creatinine Ratio: 19 (ref 9–23)
BUN: 16 mg/dL (ref 6–24)
Bilirubin Total: 0.2 mg/dL (ref 0.0–1.2)
CO2: 23 mmol/L (ref 20–29)
Calcium: 9 mg/dL (ref 8.7–10.2)
Chloride: 105 mmol/L (ref 96–106)
Creatinine, Ser: 0.84 mg/dL (ref 0.57–1.00)
Globulin, Total: 2.5 g/dL (ref 1.5–4.5)
Glucose: 96 mg/dL (ref 70–99)
Potassium: 4.3 mmol/L (ref 3.5–5.2)
Sodium: 142 mmol/L (ref 134–144)
Total Protein: 6.3 g/dL (ref 6.0–8.5)
eGFR: 87 mL/min/1.73 (ref >59)

## 2024-01-25 LAB — LIPID PANEL
Cholesterol, Total: 197 mg/dL (ref 100–199)
HDL: 66 mg/dL (ref >39)
LDL CALC COMMENT:: 3 ratio (ref 0.0–4.4)
LDL Chol Calc (NIH): 118 mg/dL — AB (ref 0–99)
Triglycerides: 69 mg/dL (ref 0–149)
VLDL Cholesterol Cal: 13 mg/dL (ref 5–40)

## 2024-01-25 LAB — URINALYSIS, ROUTINE W REFLEX MICROSCOPIC

## 2024-01-25 LAB — RPR: RPR Ser Ql: NONREACTIVE

## 2024-01-25 LAB — HIV ANTIBODY (ROUTINE TESTING W REFLEX): HIV Screen 4th Generation wRfx: NONREACTIVE

## 2024-01-25 LAB — VITAMIN D 25 HYDROXY (VIT D DEFICIENCY, FRACTURES): Vit D, 25-Hydroxy: 19.5 ng/mL — AB (ref 30.0–100.0)

## 2024-01-25 MED ORDER — FERROUS GLUCONATE 324 (38 FE) MG PO TABS: 324.0000 mg | ORAL_TABLET | Freq: Every day | ORAL | 1 refills | Status: AC

## 2024-01-25 MED ORDER — VITAMIN D (ERGOCALCIFEROL) 1.25 MG (50000 UNIT) PO CAPS: 50000.0000 [IU] | ORAL_CAPSULE | ORAL | 1 refills | Status: AC

## 2024-01-25 NOTE — Progress Notes (Signed)
 Diabetes marker 5.9% at risk for diabetes.  Blood counts show anemia.  Liver, kidney and electrolytes normal. Cholesterol ok.  Vitamin D  was low. Thyroid ok.  B12 ok  HIV and syphilis negative. Still pending gonorrhea, chlamydia tests.  I would like you to begin prescription Vitamin D  50,000 IU weekly.  We will plan to check vitamin D  periodically along with calcium, such as repeating labs in 3 months.  Foods that contain vitamin D  include seafood such as oysters, shrimp, salmon, herring, cod, egg yolks, mushrooms, milk, and foods fortified with Vitamin D  such as orange juice, cereals.  Its also important to get some sun exposure regularly to absorb vitamin D .  Given anemia findings, I recommend a colonoscopy to evaluate for colon cancer and evaluate for bleeding.  I placed referral for this.  Begin iron supplement daily.

## 2024-04-24 ENCOUNTER — Encounter: Payer: Self-pay | Admitting: Medical

## 2024-07-20 ENCOUNTER — Telehealth: Payer: Self-pay | Admitting: Internal Medicine

## 2024-07-20 NOTE — Telephone Encounter (Signed)
 Noted. Taken [facility] off as pcp  Copied from [facility] [id number]. Topic: Appointments - Transfer of Care >> Jul 20, 2024  8:40 AM Robinson H wrote: Pt is requesting to transfer FROM: Alm Gent Piedmong Pt is requesting to transfer TO: [facility] Prentiss [facility] Reason for requested transfer: Patient previously seen provider for weight loss wants to make her her pcpc It is the responsibility of the team the patient would like to transfer to (Dr. Prentiss) to reach out to the patient if for any reason this transfer is not acceptable.

## 2024-08-28 ENCOUNTER — Encounter: Admitting: Family Medicine

## 2025-02-01 ENCOUNTER — Encounter: Payer: Self-pay | Admitting: Medical
